# Patient Record
Sex: Male | Born: 1955 | Race: White | Hispanic: No | Marital: Single
Health system: Southern US, Community
[De-identification: ages and names within clinical notes are randomized; demographics above are authoritative.]

## PROBLEM LIST (undated history)

## (undated) DIAGNOSIS — D649 Anemia, unspecified: Secondary | ICD-10-CM

## (undated) DIAGNOSIS — J449 Chronic obstructive pulmonary disease, unspecified: Secondary | ICD-10-CM

## (undated) DIAGNOSIS — I2510 Atherosclerotic heart disease of native coronary artery without angina pectoris: Secondary | ICD-10-CM

## (undated) DIAGNOSIS — G2581 Restless legs syndrome: Secondary | ICD-10-CM

## (undated) DIAGNOSIS — Z8739 Personal history of other diseases of the musculoskeletal system and connective tissue: Secondary | ICD-10-CM

## (undated) DIAGNOSIS — Z87442 Personal history of urinary calculi: Secondary | ICD-10-CM

## (undated) DIAGNOSIS — K219 Gastro-esophageal reflux disease without esophagitis: Secondary | ICD-10-CM

## (undated) DIAGNOSIS — C679 Malignant neoplasm of bladder, unspecified: Secondary | ICD-10-CM

## (undated) DIAGNOSIS — J45909 Unspecified asthma, uncomplicated: Secondary | ICD-10-CM

## (undated) DIAGNOSIS — E119 Type 2 diabetes mellitus without complications: Secondary | ICD-10-CM

## (undated) DIAGNOSIS — N189 Chronic kidney disease, unspecified: Secondary | ICD-10-CM

## (undated) DIAGNOSIS — I429 Cardiomyopathy, unspecified: Secondary | ICD-10-CM

## (undated) DIAGNOSIS — G629 Polyneuropathy, unspecified: Secondary | ICD-10-CM

## (undated) DIAGNOSIS — I10 Essential (primary) hypertension: Secondary | ICD-10-CM

## (undated) DIAGNOSIS — F32A Depression, unspecified: Secondary | ICD-10-CM

## (undated) DIAGNOSIS — E7800 Pure hypercholesterolemia, unspecified: Secondary | ICD-10-CM

## (undated) DIAGNOSIS — E785 Hyperlipidemia, unspecified: Secondary | ICD-10-CM

## (undated) DIAGNOSIS — Z87448 Personal history of other diseases of urinary system: Secondary | ICD-10-CM

## (undated) DIAGNOSIS — N400 Enlarged prostate without lower urinary tract symptoms: Secondary | ICD-10-CM

## (undated) DIAGNOSIS — Z87440 Personal history of urinary (tract) infections: Secondary | ICD-10-CM

## (undated) DIAGNOSIS — F17200 Nicotine dependence, unspecified, uncomplicated: Secondary | ICD-10-CM

## (undated) HISTORY — PX: OTHER SURGICAL HISTORY: SHX169

## (undated) HISTORY — PX: CYSTOSCOPY W/ RETROGRADES: SHX1426

## (undated) HISTORY — PX: TRANSURETHRAL RESECTION OF BLADDER TUMOR: SHX2575

## (undated) HISTORY — PX: CYSTOSCOPY W/ URETERAL STENT PLACEMENT: SHX1429

## (undated) HISTORY — PX: CORONARY ANGIOPLASTY WITH STENT PLACEMENT: SHX49

## (undated) HISTORY — PX: CYSTOSCOPY W/ STONE MANIPULATION: SHX1427

---

## 2005-07-28 ENCOUNTER — Emergency Department: Payer: Self-pay | Admitting: Internal Medicine

## 2007-02-28 ENCOUNTER — Other Ambulatory Visit: Payer: Self-pay

## 2007-02-28 ENCOUNTER — Emergency Department: Payer: Self-pay | Admitting: Emergency Medicine

## 2007-10-27 ENCOUNTER — Emergency Department: Payer: Self-pay | Admitting: Emergency Medicine

## 2009-10-24 IMAGING — CT CT ABD-PELV W/ CM
1 of 3 series · 12 of 32 positions shown, 18 images · non-contrast
Comparison: none

REASON FOR EXAM: (1) abdominal pain, vomiting; (2) abd pain, vomiting
COMMENTS:

[Series 2: abdomen · axial · 0.82mm/px · z∈[-534,-134]mm · 12 of 60 slices shown, 18 images]
[im 5/60  soft-tissue]
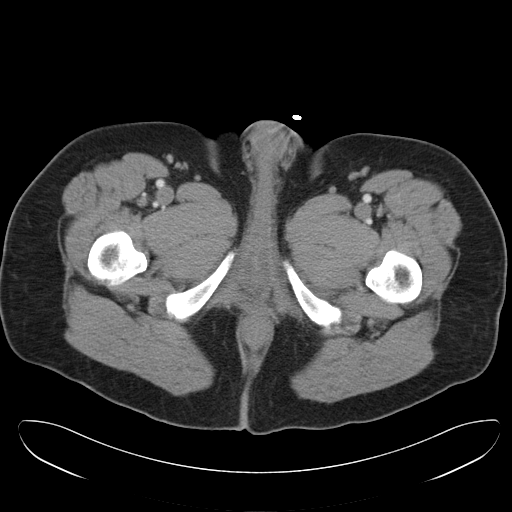
[im 5/60  bone]
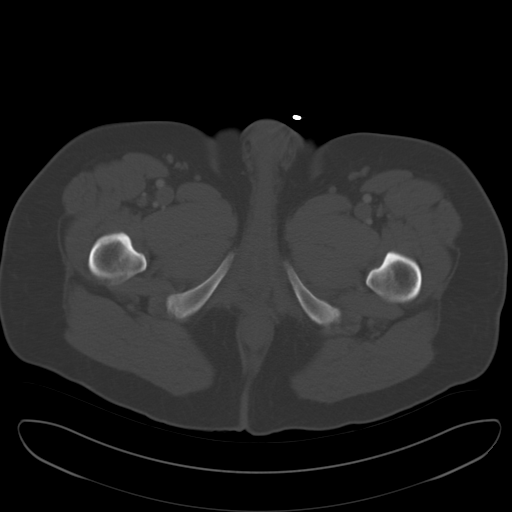
[im 10/60  soft-tissue]
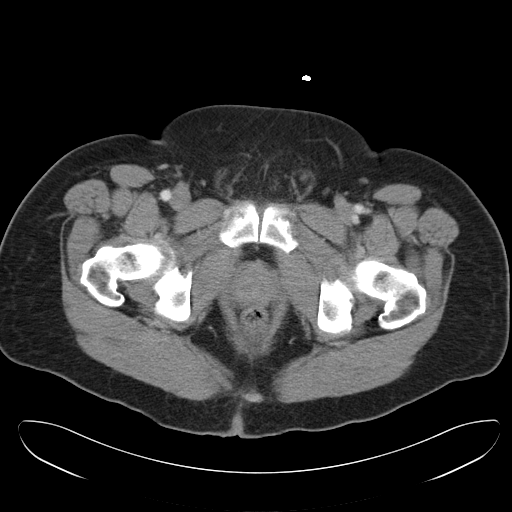
[im 14/60  soft-tissue]
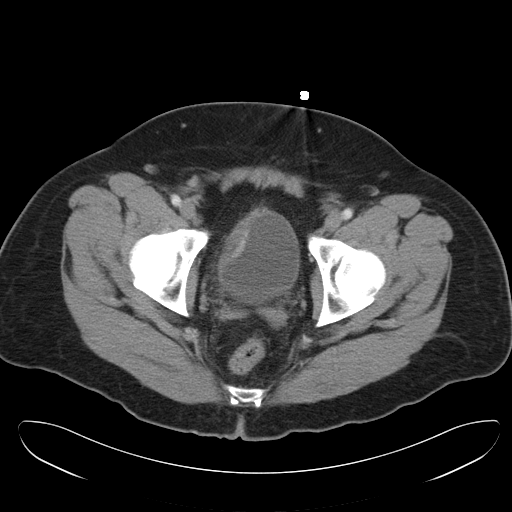
[im 19/60  soft-tissue]
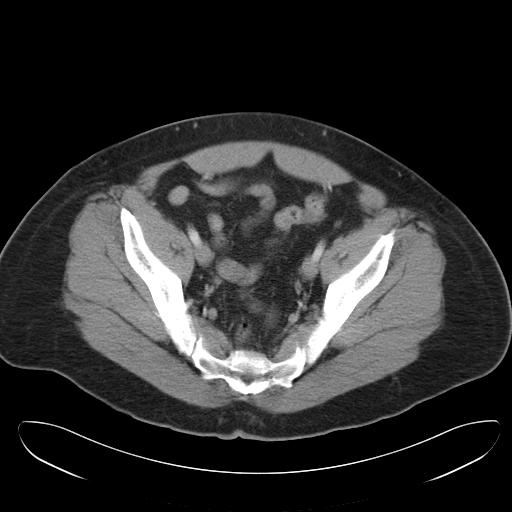
[im 23/60  soft-tissue]
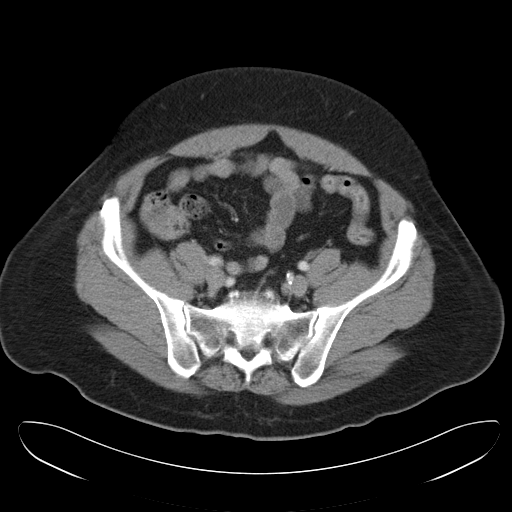
[im 28/60  soft-tissue]
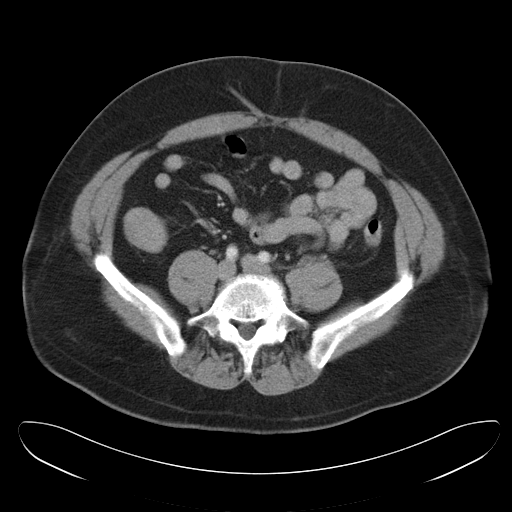
[im 32/60  soft-tissue]
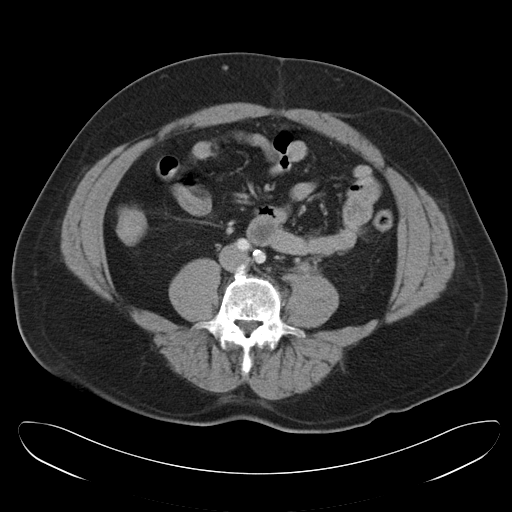
[im 37/60  soft-tissue]
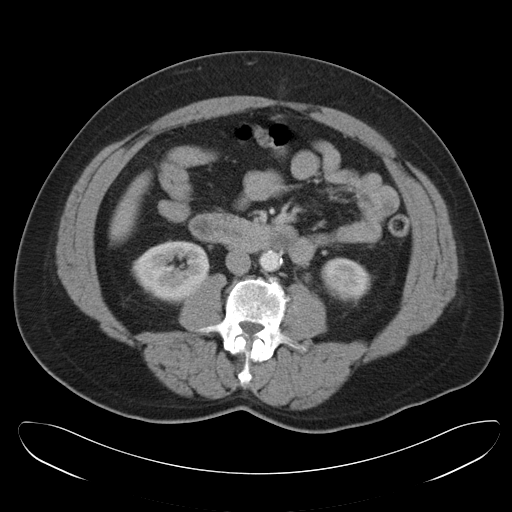
[im 41/60  soft-tissue]
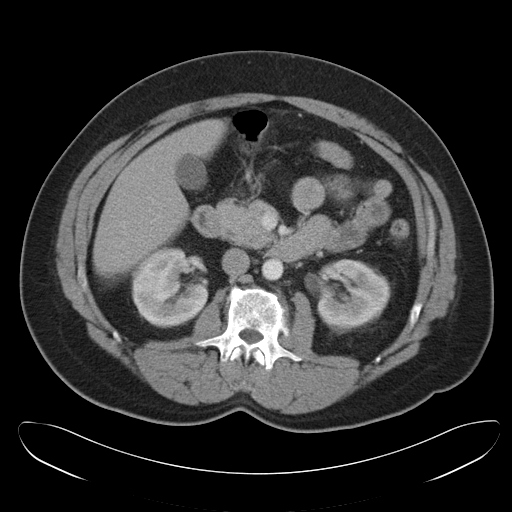
[im 41/60  lung]
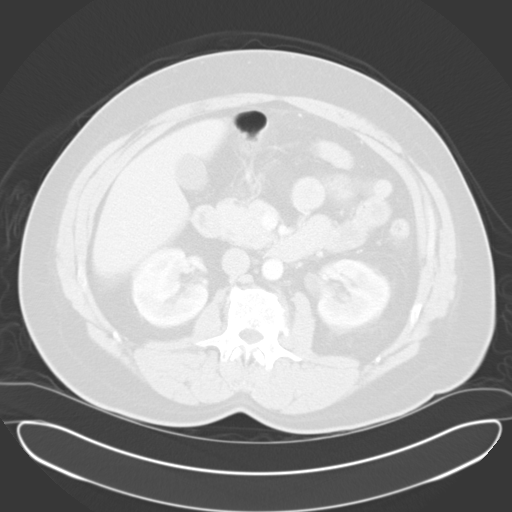
[im 41/60  bone]
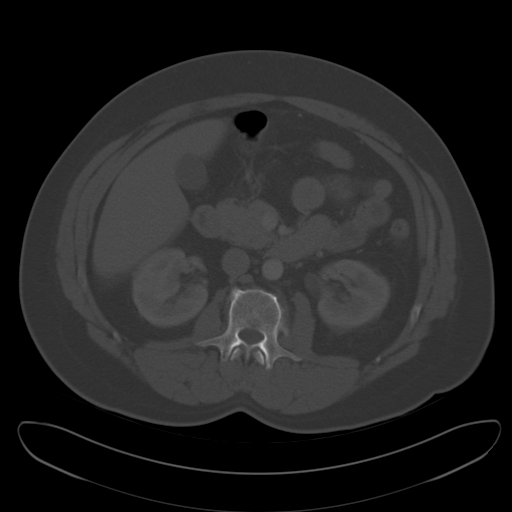
[im 46/60  soft-tissue]
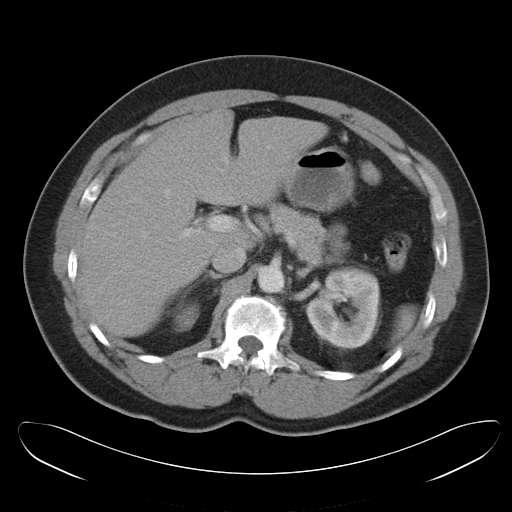
[im 46/60  lung]
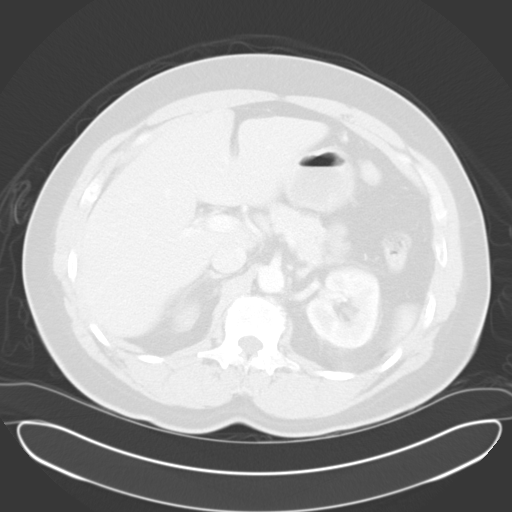
[im 50/60  soft-tissue]
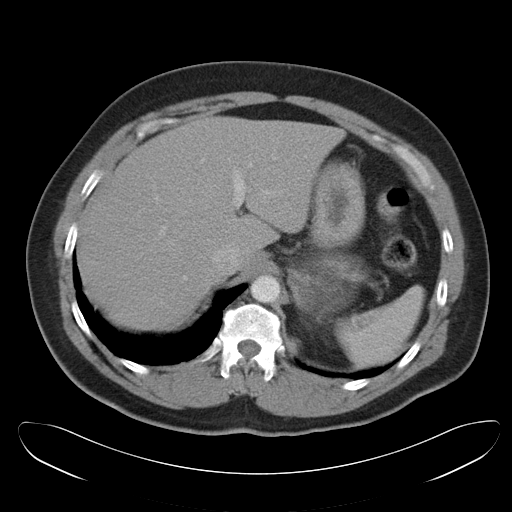
[im 50/60  lung]
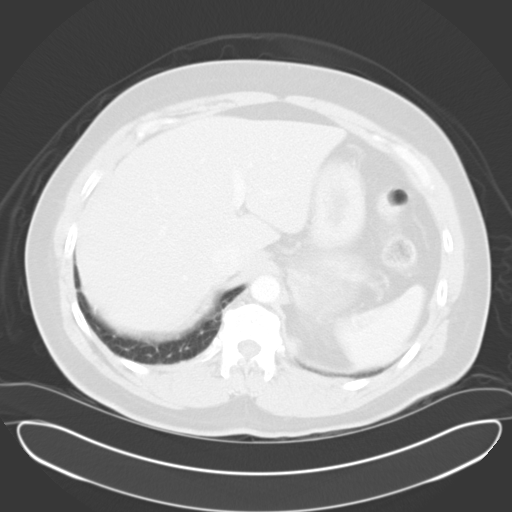
[im 55/60  soft-tissue]
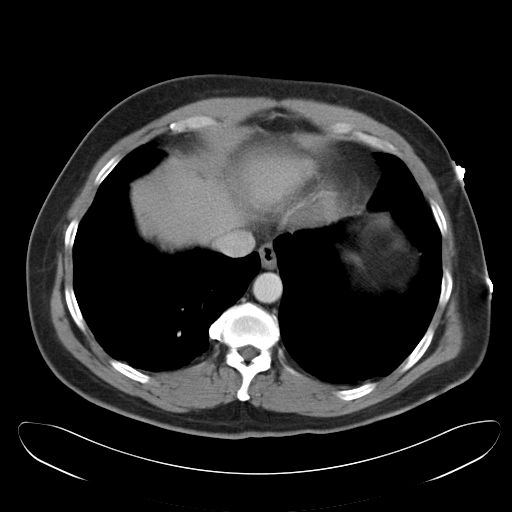
[im 55/60  lung]
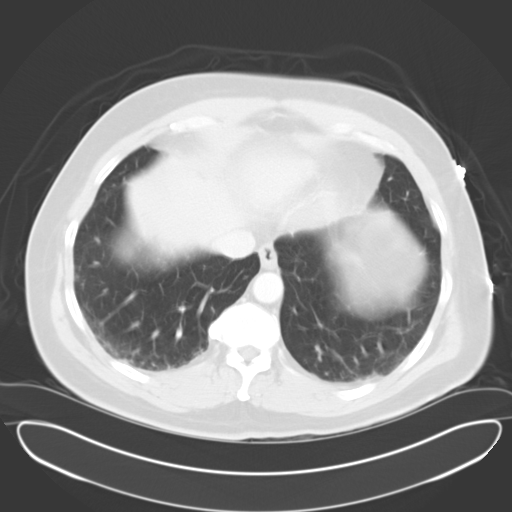

[12 of 32 positions shown; findings below may reference images not displayed]

PROCEDURE:     CT  - CT ABDOMEN / PELVIS  W  - October 27, 2007  [DATE]

RESULT:     Helical 8 mm sections were obtained from the lung bases through
the pubic symphysis status post intravenous administration of 100 ml of
Msovue-NS1.

Evaluation of the lung bases demonstrates mild hypoventilation as well as
mild interstitial and emphysematous changes.

The liver demonstrates a low attenuating 1 cm nodular area along the
periphery of the lower portion of the RIGHT lobe of the liver likely
representing a small cyst or possibly an atypical hemangioma.  No further
liver masses are identified. The spleen, adrenals, pancreas and kidneys are
unremarkable. There is no evidence of abdominal free fluid, drainable
loculated fluid collections, masses or adenopathy. There is no CT evidence
of an abdominal aortic aneurysm. Evaluation of the pelvis demonstrates no
evidence of free fluid, drainable loculated fluid collections, masses or
adenopathy. There appears to be an area of asymmetric bladder wall
thickening along the anterior lateral aspect of the urinary bladder. This
may represent the sequela of cystitis though more ominous etiologies cannot
be excluded and further evaluation with direct visualization is recommended,
if clinically warranted. There is no CT evidence of bowel obstruction,
diverticulitis, colitis, or, if clinically appropriate, secondary signs
consistent with appendicitis.
IMPRESSION: 1.Area of asymmetric bladder wall thickening as described above.
2.No further evidence of focal or acute intraabdominal or intrapelvic
pathology.

Dr. Anders of the Emergency Room was informed of these findings via a
preliminary faxed report on 10/27/07 at [DATE] a.m. Central Time.

## 2019-09-01 DIAGNOSIS — J449 Chronic obstructive pulmonary disease, unspecified: Secondary | ICD-10-CM | POA: Diagnosis not present

## 2019-09-01 DIAGNOSIS — U071 COVID-19: Secondary | ICD-10-CM | POA: Diagnosis not present

## 2019-09-01 DIAGNOSIS — J9621 Acute and chronic respiratory failure with hypoxia: Secondary | ICD-10-CM

## 2019-09-01 DIAGNOSIS — I2510 Atherosclerotic heart disease of native coronary artery without angina pectoris: Secondary | ICD-10-CM | POA: Diagnosis not present

## 2019-09-02 DIAGNOSIS — J449 Chronic obstructive pulmonary disease, unspecified: Secondary | ICD-10-CM

## 2019-09-02 DIAGNOSIS — J9621 Acute and chronic respiratory failure with hypoxia: Secondary | ICD-10-CM

## 2019-09-02 DIAGNOSIS — U071 COVID-19: Secondary | ICD-10-CM

## 2019-09-02 DIAGNOSIS — I2510 Atherosclerotic heart disease of native coronary artery without angina pectoris: Secondary | ICD-10-CM

## 2019-09-10 DIAGNOSIS — I2510 Atherosclerotic heart disease of native coronary artery without angina pectoris: Secondary | ICD-10-CM

## 2019-09-10 DIAGNOSIS — U071 COVID-19: Secondary | ICD-10-CM

## 2019-09-10 DIAGNOSIS — J449 Chronic obstructive pulmonary disease, unspecified: Secondary | ICD-10-CM | POA: Diagnosis not present

## 2019-09-10 DIAGNOSIS — J9621 Acute and chronic respiratory failure with hypoxia: Secondary | ICD-10-CM | POA: Diagnosis not present

## 2019-09-11 DIAGNOSIS — J449 Chronic obstructive pulmonary disease, unspecified: Secondary | ICD-10-CM | POA: Diagnosis not present

## 2019-09-11 DIAGNOSIS — U071 COVID-19: Secondary | ICD-10-CM | POA: Diagnosis not present

## 2019-09-11 DIAGNOSIS — J9621 Acute and chronic respiratory failure with hypoxia: Secondary | ICD-10-CM

## 2019-09-11 DIAGNOSIS — I2510 Atherosclerotic heart disease of native coronary artery without angina pectoris: Secondary | ICD-10-CM

## 2019-09-12 DIAGNOSIS — J449 Chronic obstructive pulmonary disease, unspecified: Secondary | ICD-10-CM

## 2019-09-12 DIAGNOSIS — U071 COVID-19: Secondary | ICD-10-CM

## 2019-09-12 DIAGNOSIS — I2510 Atherosclerotic heart disease of native coronary artery without angina pectoris: Secondary | ICD-10-CM

## 2019-09-12 DIAGNOSIS — J9621 Acute and chronic respiratory failure with hypoxia: Secondary | ICD-10-CM | POA: Diagnosis not present

## 2019-09-13 DIAGNOSIS — U071 COVID-19: Secondary | ICD-10-CM | POA: Diagnosis not present

## 2019-09-13 DIAGNOSIS — J449 Chronic obstructive pulmonary disease, unspecified: Secondary | ICD-10-CM

## 2019-09-13 DIAGNOSIS — J9621 Acute and chronic respiratory failure with hypoxia: Secondary | ICD-10-CM | POA: Diagnosis not present

## 2019-09-13 DIAGNOSIS — I2510 Atherosclerotic heart disease of native coronary artery without angina pectoris: Secondary | ICD-10-CM | POA: Diagnosis not present

## 2019-09-14 DIAGNOSIS — I2510 Atherosclerotic heart disease of native coronary artery without angina pectoris: Secondary | ICD-10-CM

## 2019-09-14 DIAGNOSIS — U071 COVID-19: Secondary | ICD-10-CM

## 2019-09-14 DIAGNOSIS — J9621 Acute and chronic respiratory failure with hypoxia: Secondary | ICD-10-CM | POA: Diagnosis not present

## 2019-09-14 DIAGNOSIS — J449 Chronic obstructive pulmonary disease, unspecified: Secondary | ICD-10-CM

## 2019-09-15 DIAGNOSIS — U071 COVID-19: Secondary | ICD-10-CM

## 2019-09-15 DIAGNOSIS — J449 Chronic obstructive pulmonary disease, unspecified: Secondary | ICD-10-CM

## 2019-09-15 DIAGNOSIS — J9621 Acute and chronic respiratory failure with hypoxia: Secondary | ICD-10-CM

## 2019-09-15 DIAGNOSIS — I2510 Atherosclerotic heart disease of native coronary artery without angina pectoris: Secondary | ICD-10-CM

## 2019-09-16 DIAGNOSIS — J449 Chronic obstructive pulmonary disease, unspecified: Secondary | ICD-10-CM | POA: Diagnosis not present

## 2019-09-16 DIAGNOSIS — I2510 Atherosclerotic heart disease of native coronary artery without angina pectoris: Secondary | ICD-10-CM | POA: Diagnosis not present

## 2019-09-16 DIAGNOSIS — J9621 Acute and chronic respiratory failure with hypoxia: Secondary | ICD-10-CM

## 2019-09-16 DIAGNOSIS — U071 COVID-19: Secondary | ICD-10-CM

## 2019-09-24 DIAGNOSIS — J449 Chronic obstructive pulmonary disease, unspecified: Secondary | ICD-10-CM

## 2019-09-24 DIAGNOSIS — I2510 Atherosclerotic heart disease of native coronary artery without angina pectoris: Secondary | ICD-10-CM | POA: Diagnosis not present

## 2019-09-24 DIAGNOSIS — J9621 Acute and chronic respiratory failure with hypoxia: Secondary | ICD-10-CM

## 2019-09-24 DIAGNOSIS — U071 COVID-19: Secondary | ICD-10-CM

## 2019-09-25 DIAGNOSIS — J9621 Acute and chronic respiratory failure with hypoxia: Secondary | ICD-10-CM

## 2019-09-25 DIAGNOSIS — I2510 Atherosclerotic heart disease of native coronary artery without angina pectoris: Secondary | ICD-10-CM

## 2019-09-25 DIAGNOSIS — J449 Chronic obstructive pulmonary disease, unspecified: Secondary | ICD-10-CM

## 2019-09-25 DIAGNOSIS — U071 COVID-19: Secondary | ICD-10-CM

## 2019-09-26 DIAGNOSIS — J9621 Acute and chronic respiratory failure with hypoxia: Secondary | ICD-10-CM

## 2019-09-26 DIAGNOSIS — U071 COVID-19: Secondary | ICD-10-CM | POA: Diagnosis not present

## 2019-09-26 DIAGNOSIS — J449 Chronic obstructive pulmonary disease, unspecified: Secondary | ICD-10-CM | POA: Diagnosis not present

## 2019-09-26 DIAGNOSIS — I2510 Atherosclerotic heart disease of native coronary artery without angina pectoris: Secondary | ICD-10-CM | POA: Diagnosis not present

## 2019-09-27 DIAGNOSIS — I2510 Atherosclerotic heart disease of native coronary artery without angina pectoris: Secondary | ICD-10-CM

## 2019-09-27 DIAGNOSIS — J449 Chronic obstructive pulmonary disease, unspecified: Secondary | ICD-10-CM

## 2019-09-27 DIAGNOSIS — U071 COVID-19: Secondary | ICD-10-CM | POA: Diagnosis not present

## 2019-09-27 DIAGNOSIS — J9621 Acute and chronic respiratory failure with hypoxia: Secondary | ICD-10-CM | POA: Diagnosis not present

## 2019-09-28 DIAGNOSIS — I2510 Atherosclerotic heart disease of native coronary artery without angina pectoris: Secondary | ICD-10-CM

## 2019-09-28 DIAGNOSIS — U071 COVID-19: Secondary | ICD-10-CM

## 2019-09-28 DIAGNOSIS — J449 Chronic obstructive pulmonary disease, unspecified: Secondary | ICD-10-CM | POA: Diagnosis not present

## 2019-09-28 DIAGNOSIS — J9621 Acute and chronic respiratory failure with hypoxia: Secondary | ICD-10-CM

## 2019-09-29 DIAGNOSIS — J9621 Acute and chronic respiratory failure with hypoxia: Secondary | ICD-10-CM

## 2019-09-29 DIAGNOSIS — I2510 Atherosclerotic heart disease of native coronary artery without angina pectoris: Secondary | ICD-10-CM

## 2019-09-29 DIAGNOSIS — J449 Chronic obstructive pulmonary disease, unspecified: Secondary | ICD-10-CM | POA: Diagnosis not present

## 2019-09-29 DIAGNOSIS — U071 COVID-19: Secondary | ICD-10-CM | POA: Diagnosis not present

## 2019-09-30 DIAGNOSIS — U071 COVID-19: Secondary | ICD-10-CM

## 2019-09-30 DIAGNOSIS — I2510 Atherosclerotic heart disease of native coronary artery without angina pectoris: Secondary | ICD-10-CM | POA: Diagnosis not present

## 2019-09-30 DIAGNOSIS — J9621 Acute and chronic respiratory failure with hypoxia: Secondary | ICD-10-CM

## 2019-09-30 DIAGNOSIS — J449 Chronic obstructive pulmonary disease, unspecified: Secondary | ICD-10-CM

## 2019-10-08 DIAGNOSIS — J449 Chronic obstructive pulmonary disease, unspecified: Secondary | ICD-10-CM | POA: Diagnosis not present

## 2019-10-08 DIAGNOSIS — U071 COVID-19: Secondary | ICD-10-CM | POA: Diagnosis not present

## 2019-10-08 DIAGNOSIS — I2510 Atherosclerotic heart disease of native coronary artery without angina pectoris: Secondary | ICD-10-CM | POA: Diagnosis not present

## 2019-10-08 DIAGNOSIS — J9621 Acute and chronic respiratory failure with hypoxia: Secondary | ICD-10-CM

## 2019-10-09 DIAGNOSIS — J9621 Acute and chronic respiratory failure with hypoxia: Secondary | ICD-10-CM

## 2019-10-09 DIAGNOSIS — J449 Chronic obstructive pulmonary disease, unspecified: Secondary | ICD-10-CM | POA: Diagnosis not present

## 2019-10-09 DIAGNOSIS — I2510 Atherosclerotic heart disease of native coronary artery without angina pectoris: Secondary | ICD-10-CM | POA: Diagnosis not present

## 2019-10-09 DIAGNOSIS — U071 COVID-19: Secondary | ICD-10-CM

## 2019-10-10 DIAGNOSIS — J9621 Acute and chronic respiratory failure with hypoxia: Secondary | ICD-10-CM | POA: Diagnosis not present

## 2019-10-10 DIAGNOSIS — I2510 Atherosclerotic heart disease of native coronary artery without angina pectoris: Secondary | ICD-10-CM

## 2019-10-10 DIAGNOSIS — U071 COVID-19: Secondary | ICD-10-CM | POA: Diagnosis not present

## 2019-10-10 DIAGNOSIS — J449 Chronic obstructive pulmonary disease, unspecified: Secondary | ICD-10-CM | POA: Diagnosis not present

## 2019-10-11 DIAGNOSIS — I2510 Atherosclerotic heart disease of native coronary artery without angina pectoris: Secondary | ICD-10-CM | POA: Diagnosis not present

## 2019-10-11 DIAGNOSIS — J449 Chronic obstructive pulmonary disease, unspecified: Secondary | ICD-10-CM

## 2019-10-11 DIAGNOSIS — J9621 Acute and chronic respiratory failure with hypoxia: Secondary | ICD-10-CM

## 2019-10-11 DIAGNOSIS — U071 COVID-19: Secondary | ICD-10-CM | POA: Diagnosis not present

## 2019-10-12 DIAGNOSIS — U071 COVID-19: Secondary | ICD-10-CM

## 2019-10-12 DIAGNOSIS — J9621 Acute and chronic respiratory failure with hypoxia: Secondary | ICD-10-CM

## 2019-10-12 DIAGNOSIS — J449 Chronic obstructive pulmonary disease, unspecified: Secondary | ICD-10-CM

## 2019-10-12 DIAGNOSIS — I2510 Atherosclerotic heart disease of native coronary artery without angina pectoris: Secondary | ICD-10-CM

## 2019-10-13 DIAGNOSIS — I2510 Atherosclerotic heart disease of native coronary artery without angina pectoris: Secondary | ICD-10-CM | POA: Diagnosis not present

## 2019-10-13 DIAGNOSIS — J449 Chronic obstructive pulmonary disease, unspecified: Secondary | ICD-10-CM

## 2019-10-13 DIAGNOSIS — J9621 Acute and chronic respiratory failure with hypoxia: Secondary | ICD-10-CM

## 2019-10-13 DIAGNOSIS — U071 COVID-19: Secondary | ICD-10-CM

## 2019-10-14 DIAGNOSIS — J9621 Acute and chronic respiratory failure with hypoxia: Secondary | ICD-10-CM

## 2019-10-14 DIAGNOSIS — U071 COVID-19: Secondary | ICD-10-CM

## 2019-10-14 DIAGNOSIS — J449 Chronic obstructive pulmonary disease, unspecified: Secondary | ICD-10-CM

## 2019-10-14 DIAGNOSIS — I2510 Atherosclerotic heart disease of native coronary artery without angina pectoris: Secondary | ICD-10-CM

## 2019-10-19 DIAGNOSIS — U071 COVID-19: Secondary | ICD-10-CM

## 2019-10-19 DIAGNOSIS — I2510 Atherosclerotic heart disease of native coronary artery without angina pectoris: Secondary | ICD-10-CM | POA: Diagnosis not present

## 2019-10-19 DIAGNOSIS — J449 Chronic obstructive pulmonary disease, unspecified: Secondary | ICD-10-CM

## 2019-10-19 DIAGNOSIS — J9621 Acute and chronic respiratory failure with hypoxia: Secondary | ICD-10-CM

## 2019-10-20 DIAGNOSIS — U071 COVID-19: Secondary | ICD-10-CM

## 2019-10-20 DIAGNOSIS — J449 Chronic obstructive pulmonary disease, unspecified: Secondary | ICD-10-CM

## 2019-10-20 DIAGNOSIS — I2510 Atherosclerotic heart disease of native coronary artery without angina pectoris: Secondary | ICD-10-CM

## 2019-10-20 DIAGNOSIS — J9621 Acute and chronic respiratory failure with hypoxia: Secondary | ICD-10-CM | POA: Diagnosis not present

## 2019-10-21 DIAGNOSIS — I2510 Atherosclerotic heart disease of native coronary artery without angina pectoris: Secondary | ICD-10-CM

## 2019-10-21 DIAGNOSIS — J9621 Acute and chronic respiratory failure with hypoxia: Secondary | ICD-10-CM | POA: Diagnosis not present

## 2019-10-21 DIAGNOSIS — J449 Chronic obstructive pulmonary disease, unspecified: Secondary | ICD-10-CM | POA: Diagnosis not present

## 2019-10-21 DIAGNOSIS — U071 COVID-19: Secondary | ICD-10-CM | POA: Diagnosis not present

## 2019-10-22 DIAGNOSIS — J9621 Acute and chronic respiratory failure with hypoxia: Secondary | ICD-10-CM

## 2019-10-22 DIAGNOSIS — J449 Chronic obstructive pulmonary disease, unspecified: Secondary | ICD-10-CM

## 2019-10-22 DIAGNOSIS — U071 COVID-19: Secondary | ICD-10-CM | POA: Diagnosis not present

## 2019-10-22 DIAGNOSIS — I2510 Atherosclerotic heart disease of native coronary artery without angina pectoris: Secondary | ICD-10-CM | POA: Diagnosis not present

## 2019-10-23 DIAGNOSIS — U071 COVID-19: Secondary | ICD-10-CM | POA: Diagnosis not present

## 2019-10-23 DIAGNOSIS — J9621 Acute and chronic respiratory failure with hypoxia: Secondary | ICD-10-CM

## 2019-10-23 DIAGNOSIS — J449 Chronic obstructive pulmonary disease, unspecified: Secondary | ICD-10-CM | POA: Diagnosis not present

## 2019-10-23 DIAGNOSIS — I2510 Atherosclerotic heart disease of native coronary artery without angina pectoris: Secondary | ICD-10-CM

## 2019-10-24 DIAGNOSIS — U071 COVID-19: Secondary | ICD-10-CM | POA: Diagnosis not present

## 2019-10-24 DIAGNOSIS — I2510 Atherosclerotic heart disease of native coronary artery without angina pectoris: Secondary | ICD-10-CM

## 2019-10-24 DIAGNOSIS — J9621 Acute and chronic respiratory failure with hypoxia: Secondary | ICD-10-CM

## 2019-10-24 DIAGNOSIS — J449 Chronic obstructive pulmonary disease, unspecified: Secondary | ICD-10-CM

## 2019-10-25 DIAGNOSIS — U071 COVID-19: Secondary | ICD-10-CM | POA: Diagnosis not present

## 2019-10-25 DIAGNOSIS — J449 Chronic obstructive pulmonary disease, unspecified: Secondary | ICD-10-CM

## 2019-10-25 DIAGNOSIS — I2510 Atherosclerotic heart disease of native coronary artery without angina pectoris: Secondary | ICD-10-CM | POA: Diagnosis not present

## 2019-10-25 DIAGNOSIS — J9621 Acute and chronic respiratory failure with hypoxia: Secondary | ICD-10-CM

## 2019-11-05 DIAGNOSIS — U071 COVID-19: Secondary | ICD-10-CM

## 2019-11-05 DIAGNOSIS — J449 Chronic obstructive pulmonary disease, unspecified: Secondary | ICD-10-CM

## 2019-11-05 DIAGNOSIS — I2510 Atherosclerotic heart disease of native coronary artery without angina pectoris: Secondary | ICD-10-CM

## 2019-11-05 DIAGNOSIS — J9621 Acute and chronic respiratory failure with hypoxia: Secondary | ICD-10-CM

## 2019-11-06 DIAGNOSIS — U071 COVID-19: Secondary | ICD-10-CM

## 2019-11-06 DIAGNOSIS — J9621 Acute and chronic respiratory failure with hypoxia: Secondary | ICD-10-CM

## 2019-11-06 DIAGNOSIS — J449 Chronic obstructive pulmonary disease, unspecified: Secondary | ICD-10-CM

## 2019-11-06 DIAGNOSIS — I2510 Atherosclerotic heart disease of native coronary artery without angina pectoris: Secondary | ICD-10-CM | POA: Diagnosis not present

## 2019-11-07 DIAGNOSIS — I2510 Atherosclerotic heart disease of native coronary artery without angina pectoris: Secondary | ICD-10-CM

## 2019-11-07 DIAGNOSIS — J449 Chronic obstructive pulmonary disease, unspecified: Secondary | ICD-10-CM | POA: Diagnosis not present

## 2019-11-07 DIAGNOSIS — J9621 Acute and chronic respiratory failure with hypoxia: Secondary | ICD-10-CM

## 2019-11-07 DIAGNOSIS — U071 COVID-19: Secondary | ICD-10-CM

## 2019-11-08 DIAGNOSIS — J9621 Acute and chronic respiratory failure with hypoxia: Secondary | ICD-10-CM

## 2019-11-08 DIAGNOSIS — J449 Chronic obstructive pulmonary disease, unspecified: Secondary | ICD-10-CM

## 2019-11-08 DIAGNOSIS — I2510 Atherosclerotic heart disease of native coronary artery without angina pectoris: Secondary | ICD-10-CM | POA: Diagnosis not present

## 2019-11-08 DIAGNOSIS — U071 COVID-19: Secondary | ICD-10-CM

## 2019-11-09 DIAGNOSIS — J9621 Acute and chronic respiratory failure with hypoxia: Secondary | ICD-10-CM | POA: Diagnosis not present

## 2019-11-09 DIAGNOSIS — I2510 Atherosclerotic heart disease of native coronary artery without angina pectoris: Secondary | ICD-10-CM

## 2019-11-09 DIAGNOSIS — U071 COVID-19: Secondary | ICD-10-CM | POA: Diagnosis not present

## 2019-11-09 DIAGNOSIS — J449 Chronic obstructive pulmonary disease, unspecified: Secondary | ICD-10-CM | POA: Diagnosis not present

## 2019-11-10 DIAGNOSIS — U071 COVID-19: Secondary | ICD-10-CM

## 2019-11-10 DIAGNOSIS — J449 Chronic obstructive pulmonary disease, unspecified: Secondary | ICD-10-CM

## 2019-11-10 DIAGNOSIS — J9621 Acute and chronic respiratory failure with hypoxia: Secondary | ICD-10-CM

## 2019-11-10 DIAGNOSIS — I2510 Atherosclerotic heart disease of native coronary artery without angina pectoris: Secondary | ICD-10-CM

## 2019-11-11 DIAGNOSIS — I2510 Atherosclerotic heart disease of native coronary artery without angina pectoris: Secondary | ICD-10-CM

## 2019-11-11 DIAGNOSIS — J9621 Acute and chronic respiratory failure with hypoxia: Secondary | ICD-10-CM | POA: Diagnosis not present

## 2019-11-11 DIAGNOSIS — J449 Chronic obstructive pulmonary disease, unspecified: Secondary | ICD-10-CM | POA: Diagnosis not present

## 2019-11-11 DIAGNOSIS — U071 COVID-19: Secondary | ICD-10-CM | POA: Diagnosis not present

## 2019-11-19 DIAGNOSIS — U071 COVID-19: Secondary | ICD-10-CM

## 2019-11-19 DIAGNOSIS — J449 Chronic obstructive pulmonary disease, unspecified: Secondary | ICD-10-CM | POA: Diagnosis not present

## 2019-11-19 DIAGNOSIS — J9621 Acute and chronic respiratory failure with hypoxia: Secondary | ICD-10-CM | POA: Diagnosis not present

## 2019-11-19 DIAGNOSIS — I2510 Atherosclerotic heart disease of native coronary artery without angina pectoris: Secondary | ICD-10-CM

## 2019-11-20 DIAGNOSIS — I2510 Atherosclerotic heart disease of native coronary artery without angina pectoris: Secondary | ICD-10-CM

## 2019-11-20 DIAGNOSIS — J9621 Acute and chronic respiratory failure with hypoxia: Secondary | ICD-10-CM

## 2019-11-20 DIAGNOSIS — U071 COVID-19: Secondary | ICD-10-CM | POA: Diagnosis not present

## 2019-11-20 DIAGNOSIS — J449 Chronic obstructive pulmonary disease, unspecified: Secondary | ICD-10-CM | POA: Diagnosis not present

## 2019-11-21 DIAGNOSIS — J449 Chronic obstructive pulmonary disease, unspecified: Secondary | ICD-10-CM | POA: Diagnosis not present

## 2019-11-21 DIAGNOSIS — J9621 Acute and chronic respiratory failure with hypoxia: Secondary | ICD-10-CM | POA: Diagnosis not present

## 2019-11-21 DIAGNOSIS — U071 COVID-19: Secondary | ICD-10-CM

## 2019-11-21 DIAGNOSIS — I2510 Atherosclerotic heart disease of native coronary artery without angina pectoris: Secondary | ICD-10-CM | POA: Diagnosis not present

## 2019-11-22 DIAGNOSIS — I2510 Atherosclerotic heart disease of native coronary artery without angina pectoris: Secondary | ICD-10-CM | POA: Diagnosis not present

## 2019-11-22 DIAGNOSIS — U071 COVID-19: Secondary | ICD-10-CM

## 2019-11-22 DIAGNOSIS — J449 Chronic obstructive pulmonary disease, unspecified: Secondary | ICD-10-CM

## 2019-11-22 DIAGNOSIS — J9621 Acute and chronic respiratory failure with hypoxia: Secondary | ICD-10-CM | POA: Diagnosis not present

## 2019-11-23 DIAGNOSIS — I2510 Atherosclerotic heart disease of native coronary artery without angina pectoris: Secondary | ICD-10-CM

## 2019-11-23 DIAGNOSIS — J449 Chronic obstructive pulmonary disease, unspecified: Secondary | ICD-10-CM

## 2019-11-23 DIAGNOSIS — J9621 Acute and chronic respiratory failure with hypoxia: Secondary | ICD-10-CM | POA: Diagnosis not present

## 2019-11-23 DIAGNOSIS — U071 COVID-19: Secondary | ICD-10-CM

## 2019-11-24 DIAGNOSIS — U071 COVID-19: Secondary | ICD-10-CM

## 2019-11-24 DIAGNOSIS — J449 Chronic obstructive pulmonary disease, unspecified: Secondary | ICD-10-CM | POA: Diagnosis not present

## 2019-11-24 DIAGNOSIS — I2510 Atherosclerotic heart disease of native coronary artery without angina pectoris: Secondary | ICD-10-CM

## 2019-11-24 DIAGNOSIS — J9621 Acute and chronic respiratory failure with hypoxia: Secondary | ICD-10-CM | POA: Diagnosis not present

## 2019-11-25 DIAGNOSIS — J449 Chronic obstructive pulmonary disease, unspecified: Secondary | ICD-10-CM | POA: Diagnosis not present

## 2019-11-25 DIAGNOSIS — U071 COVID-19: Secondary | ICD-10-CM | POA: Diagnosis not present

## 2019-11-25 DIAGNOSIS — I2510 Atherosclerotic heart disease of native coronary artery without angina pectoris: Secondary | ICD-10-CM

## 2019-11-25 DIAGNOSIS — J9621 Acute and chronic respiratory failure with hypoxia: Secondary | ICD-10-CM | POA: Diagnosis not present

## 2019-12-03 DIAGNOSIS — U071 COVID-19: Secondary | ICD-10-CM | POA: Diagnosis not present

## 2019-12-03 DIAGNOSIS — J449 Chronic obstructive pulmonary disease, unspecified: Secondary | ICD-10-CM | POA: Diagnosis not present

## 2019-12-03 DIAGNOSIS — J9621 Acute and chronic respiratory failure with hypoxia: Secondary | ICD-10-CM | POA: Diagnosis not present

## 2019-12-03 DIAGNOSIS — I2510 Atherosclerotic heart disease of native coronary artery without angina pectoris: Secondary | ICD-10-CM

## 2019-12-04 DIAGNOSIS — J449 Chronic obstructive pulmonary disease, unspecified: Secondary | ICD-10-CM

## 2019-12-04 DIAGNOSIS — I2510 Atherosclerotic heart disease of native coronary artery without angina pectoris: Secondary | ICD-10-CM | POA: Diagnosis not present

## 2019-12-04 DIAGNOSIS — J9621 Acute and chronic respiratory failure with hypoxia: Secondary | ICD-10-CM

## 2019-12-04 DIAGNOSIS — U071 COVID-19: Secondary | ICD-10-CM | POA: Diagnosis not present

## 2019-12-05 DIAGNOSIS — J449 Chronic obstructive pulmonary disease, unspecified: Secondary | ICD-10-CM | POA: Diagnosis not present

## 2019-12-05 DIAGNOSIS — I2510 Atherosclerotic heart disease of native coronary artery without angina pectoris: Secondary | ICD-10-CM | POA: Diagnosis not present

## 2019-12-05 DIAGNOSIS — J9621 Acute and chronic respiratory failure with hypoxia: Secondary | ICD-10-CM | POA: Diagnosis not present

## 2019-12-05 DIAGNOSIS — U071 COVID-19: Secondary | ICD-10-CM | POA: Diagnosis not present

## 2019-12-06 DIAGNOSIS — J449 Chronic obstructive pulmonary disease, unspecified: Secondary | ICD-10-CM | POA: Diagnosis not present

## 2019-12-06 DIAGNOSIS — U071 COVID-19: Secondary | ICD-10-CM | POA: Diagnosis not present

## 2019-12-06 DIAGNOSIS — I2510 Atherosclerotic heart disease of native coronary artery without angina pectoris: Secondary | ICD-10-CM | POA: Diagnosis not present

## 2019-12-06 DIAGNOSIS — J9621 Acute and chronic respiratory failure with hypoxia: Secondary | ICD-10-CM | POA: Diagnosis not present

## 2019-12-14 DIAGNOSIS — J9621 Acute and chronic respiratory failure with hypoxia: Secondary | ICD-10-CM | POA: Diagnosis not present

## 2019-12-14 DIAGNOSIS — U071 COVID-19: Secondary | ICD-10-CM | POA: Diagnosis not present

## 2019-12-14 DIAGNOSIS — I2510 Atherosclerotic heart disease of native coronary artery without angina pectoris: Secondary | ICD-10-CM | POA: Diagnosis not present

## 2019-12-14 DIAGNOSIS — J449 Chronic obstructive pulmonary disease, unspecified: Secondary | ICD-10-CM

## 2019-12-15 DIAGNOSIS — I2510 Atherosclerotic heart disease of native coronary artery without angina pectoris: Secondary | ICD-10-CM

## 2019-12-15 DIAGNOSIS — J9621 Acute and chronic respiratory failure with hypoxia: Secondary | ICD-10-CM

## 2019-12-15 DIAGNOSIS — J449 Chronic obstructive pulmonary disease, unspecified: Secondary | ICD-10-CM | POA: Diagnosis not present

## 2019-12-15 DIAGNOSIS — U071 COVID-19: Secondary | ICD-10-CM | POA: Diagnosis not present

## 2019-12-16 DIAGNOSIS — J9621 Acute and chronic respiratory failure with hypoxia: Secondary | ICD-10-CM | POA: Diagnosis not present

## 2019-12-16 DIAGNOSIS — I2510 Atherosclerotic heart disease of native coronary artery without angina pectoris: Secondary | ICD-10-CM

## 2019-12-16 DIAGNOSIS — J449 Chronic obstructive pulmonary disease, unspecified: Secondary | ICD-10-CM | POA: Diagnosis not present

## 2019-12-16 DIAGNOSIS — U071 COVID-19: Secondary | ICD-10-CM

## 2019-12-17 DIAGNOSIS — J449 Chronic obstructive pulmonary disease, unspecified: Secondary | ICD-10-CM | POA: Diagnosis not present

## 2019-12-17 DIAGNOSIS — J9621 Acute and chronic respiratory failure with hypoxia: Secondary | ICD-10-CM

## 2019-12-17 DIAGNOSIS — I2510 Atherosclerotic heart disease of native coronary artery without angina pectoris: Secondary | ICD-10-CM

## 2019-12-17 DIAGNOSIS — U071 COVID-19: Secondary | ICD-10-CM | POA: Diagnosis not present

## 2019-12-18 DIAGNOSIS — U071 COVID-19: Secondary | ICD-10-CM

## 2019-12-18 DIAGNOSIS — I2510 Atherosclerotic heart disease of native coronary artery without angina pectoris: Secondary | ICD-10-CM | POA: Diagnosis not present

## 2019-12-18 DIAGNOSIS — J9621 Acute and chronic respiratory failure with hypoxia: Secondary | ICD-10-CM

## 2019-12-18 DIAGNOSIS — J449 Chronic obstructive pulmonary disease, unspecified: Secondary | ICD-10-CM | POA: Diagnosis not present

## 2019-12-19 DIAGNOSIS — I2510 Atherosclerotic heart disease of native coronary artery without angina pectoris: Secondary | ICD-10-CM | POA: Diagnosis not present

## 2019-12-19 DIAGNOSIS — J449 Chronic obstructive pulmonary disease, unspecified: Secondary | ICD-10-CM | POA: Diagnosis not present

## 2019-12-19 DIAGNOSIS — J9621 Acute and chronic respiratory failure with hypoxia: Secondary | ICD-10-CM

## 2019-12-19 DIAGNOSIS — U071 COVID-19: Secondary | ICD-10-CM

## 2019-12-20 DIAGNOSIS — U071 COVID-19: Secondary | ICD-10-CM

## 2019-12-20 DIAGNOSIS — I2510 Atherosclerotic heart disease of native coronary artery without angina pectoris: Secondary | ICD-10-CM | POA: Diagnosis not present

## 2019-12-20 DIAGNOSIS — J449 Chronic obstructive pulmonary disease, unspecified: Secondary | ICD-10-CM | POA: Diagnosis not present

## 2019-12-20 DIAGNOSIS — J9621 Acute and chronic respiratory failure with hypoxia: Secondary | ICD-10-CM | POA: Diagnosis not present

## 2019-12-21 DIAGNOSIS — I2510 Atherosclerotic heart disease of native coronary artery without angina pectoris: Secondary | ICD-10-CM

## 2019-12-21 DIAGNOSIS — J449 Chronic obstructive pulmonary disease, unspecified: Secondary | ICD-10-CM | POA: Diagnosis not present

## 2019-12-21 DIAGNOSIS — J9621 Acute and chronic respiratory failure with hypoxia: Secondary | ICD-10-CM

## 2019-12-21 DIAGNOSIS — U071 COVID-19: Secondary | ICD-10-CM

## 2019-12-22 DIAGNOSIS — J449 Chronic obstructive pulmonary disease, unspecified: Secondary | ICD-10-CM | POA: Diagnosis not present

## 2019-12-22 DIAGNOSIS — U071 COVID-19: Secondary | ICD-10-CM | POA: Diagnosis not present

## 2019-12-22 DIAGNOSIS — J9621 Acute and chronic respiratory failure with hypoxia: Secondary | ICD-10-CM

## 2019-12-22 DIAGNOSIS — I2510 Atherosclerotic heart disease of native coronary artery without angina pectoris: Secondary | ICD-10-CM

## 2019-12-23 DIAGNOSIS — I2510 Atherosclerotic heart disease of native coronary artery without angina pectoris: Secondary | ICD-10-CM

## 2019-12-23 DIAGNOSIS — J9621 Acute and chronic respiratory failure with hypoxia: Secondary | ICD-10-CM | POA: Diagnosis not present

## 2019-12-23 DIAGNOSIS — J449 Chronic obstructive pulmonary disease, unspecified: Secondary | ICD-10-CM

## 2019-12-23 DIAGNOSIS — U071 COVID-19: Secondary | ICD-10-CM | POA: Diagnosis not present

## 2019-12-31 DIAGNOSIS — J9621 Acute and chronic respiratory failure with hypoxia: Secondary | ICD-10-CM

## 2019-12-31 DIAGNOSIS — J449 Chronic obstructive pulmonary disease, unspecified: Secondary | ICD-10-CM | POA: Diagnosis not present

## 2019-12-31 DIAGNOSIS — I2510 Atherosclerotic heart disease of native coronary artery without angina pectoris: Secondary | ICD-10-CM | POA: Diagnosis not present

## 2019-12-31 DIAGNOSIS — U071 COVID-19: Secondary | ICD-10-CM

## 2020-01-01 DIAGNOSIS — J9621 Acute and chronic respiratory failure with hypoxia: Secondary | ICD-10-CM | POA: Diagnosis not present

## 2020-01-01 DIAGNOSIS — I2510 Atherosclerotic heart disease of native coronary artery without angina pectoris: Secondary | ICD-10-CM | POA: Diagnosis not present

## 2020-01-01 DIAGNOSIS — U071 COVID-19: Secondary | ICD-10-CM

## 2020-01-01 DIAGNOSIS — J449 Chronic obstructive pulmonary disease, unspecified: Secondary | ICD-10-CM | POA: Diagnosis not present

## 2020-01-02 DIAGNOSIS — J9621 Acute and chronic respiratory failure with hypoxia: Secondary | ICD-10-CM

## 2020-01-02 DIAGNOSIS — U071 COVID-19: Secondary | ICD-10-CM

## 2020-01-02 DIAGNOSIS — I2510 Atherosclerotic heart disease of native coronary artery without angina pectoris: Secondary | ICD-10-CM | POA: Diagnosis not present

## 2020-01-02 DIAGNOSIS — J449 Chronic obstructive pulmonary disease, unspecified: Secondary | ICD-10-CM

## 2020-01-03 DIAGNOSIS — U071 COVID-19: Secondary | ICD-10-CM | POA: Diagnosis not present

## 2020-01-03 DIAGNOSIS — I2510 Atherosclerotic heart disease of native coronary artery without angina pectoris: Secondary | ICD-10-CM | POA: Diagnosis not present

## 2020-01-03 DIAGNOSIS — J449 Chronic obstructive pulmonary disease, unspecified: Secondary | ICD-10-CM | POA: Diagnosis not present

## 2020-01-03 DIAGNOSIS — J9621 Acute and chronic respiratory failure with hypoxia: Secondary | ICD-10-CM

## 2020-01-04 DIAGNOSIS — J9621 Acute and chronic respiratory failure with hypoxia: Secondary | ICD-10-CM | POA: Diagnosis not present

## 2020-01-04 DIAGNOSIS — I2510 Atherosclerotic heart disease of native coronary artery without angina pectoris: Secondary | ICD-10-CM | POA: Diagnosis not present

## 2020-01-04 DIAGNOSIS — J449 Chronic obstructive pulmonary disease, unspecified: Secondary | ICD-10-CM

## 2020-01-04 DIAGNOSIS — U071 COVID-19: Secondary | ICD-10-CM | POA: Diagnosis not present

## 2020-01-05 DIAGNOSIS — U071 COVID-19: Secondary | ICD-10-CM | POA: Diagnosis not present

## 2020-01-05 DIAGNOSIS — J9621 Acute and chronic respiratory failure with hypoxia: Secondary | ICD-10-CM

## 2020-01-05 DIAGNOSIS — I2510 Atherosclerotic heart disease of native coronary artery without angina pectoris: Secondary | ICD-10-CM

## 2020-01-05 DIAGNOSIS — J449 Chronic obstructive pulmonary disease, unspecified: Secondary | ICD-10-CM

## 2020-01-06 DIAGNOSIS — J449 Chronic obstructive pulmonary disease, unspecified: Secondary | ICD-10-CM

## 2020-01-06 DIAGNOSIS — J9621 Acute and chronic respiratory failure with hypoxia: Secondary | ICD-10-CM | POA: Diagnosis not present

## 2020-01-06 DIAGNOSIS — U071 COVID-19: Secondary | ICD-10-CM | POA: Diagnosis not present

## 2020-01-06 DIAGNOSIS — I2510 Atherosclerotic heart disease of native coronary artery without angina pectoris: Secondary | ICD-10-CM

## 2020-01-14 DIAGNOSIS — J9621 Acute and chronic respiratory failure with hypoxia: Secondary | ICD-10-CM

## 2020-01-14 DIAGNOSIS — J449 Chronic obstructive pulmonary disease, unspecified: Secondary | ICD-10-CM

## 2020-01-14 DIAGNOSIS — U071 COVID-19: Secondary | ICD-10-CM

## 2020-01-14 DIAGNOSIS — I2510 Atherosclerotic heart disease of native coronary artery without angina pectoris: Secondary | ICD-10-CM

## 2020-01-15 DIAGNOSIS — I2510 Atherosclerotic heart disease of native coronary artery without angina pectoris: Secondary | ICD-10-CM | POA: Diagnosis not present

## 2020-01-15 DIAGNOSIS — J9621 Acute and chronic respiratory failure with hypoxia: Secondary | ICD-10-CM | POA: Diagnosis not present

## 2020-01-15 DIAGNOSIS — J449 Chronic obstructive pulmonary disease, unspecified: Secondary | ICD-10-CM | POA: Diagnosis not present

## 2020-01-15 DIAGNOSIS — U071 COVID-19: Secondary | ICD-10-CM | POA: Diagnosis not present

## 2020-01-16 DIAGNOSIS — I2510 Atherosclerotic heart disease of native coronary artery without angina pectoris: Secondary | ICD-10-CM

## 2020-01-16 DIAGNOSIS — J9621 Acute and chronic respiratory failure with hypoxia: Secondary | ICD-10-CM | POA: Diagnosis not present

## 2020-01-16 DIAGNOSIS — J449 Chronic obstructive pulmonary disease, unspecified: Secondary | ICD-10-CM | POA: Diagnosis not present

## 2020-01-16 DIAGNOSIS — U071 COVID-19: Secondary | ICD-10-CM

## 2020-01-17 DIAGNOSIS — I2510 Atherosclerotic heart disease of native coronary artery without angina pectoris: Secondary | ICD-10-CM | POA: Diagnosis not present

## 2020-01-17 DIAGNOSIS — U071 COVID-19: Secondary | ICD-10-CM | POA: Diagnosis not present

## 2020-01-17 DIAGNOSIS — J449 Chronic obstructive pulmonary disease, unspecified: Secondary | ICD-10-CM | POA: Diagnosis not present

## 2020-01-17 DIAGNOSIS — J9621 Acute and chronic respiratory failure with hypoxia: Secondary | ICD-10-CM

## 2020-01-18 DIAGNOSIS — J9621 Acute and chronic respiratory failure with hypoxia: Secondary | ICD-10-CM

## 2020-01-18 DIAGNOSIS — U071 COVID-19: Secondary | ICD-10-CM | POA: Diagnosis not present

## 2020-01-18 DIAGNOSIS — J449 Chronic obstructive pulmonary disease, unspecified: Secondary | ICD-10-CM | POA: Diagnosis not present

## 2020-01-18 DIAGNOSIS — I2510 Atherosclerotic heart disease of native coronary artery without angina pectoris: Secondary | ICD-10-CM

## 2020-01-19 DIAGNOSIS — J9621 Acute and chronic respiratory failure with hypoxia: Secondary | ICD-10-CM | POA: Diagnosis not present

## 2020-01-19 DIAGNOSIS — I2510 Atherosclerotic heart disease of native coronary artery without angina pectoris: Secondary | ICD-10-CM | POA: Diagnosis not present

## 2020-01-19 DIAGNOSIS — J449 Chronic obstructive pulmonary disease, unspecified: Secondary | ICD-10-CM | POA: Diagnosis not present

## 2020-01-19 DIAGNOSIS — U071 COVID-19: Secondary | ICD-10-CM | POA: Diagnosis not present

## 2020-01-20 DIAGNOSIS — J9621 Acute and chronic respiratory failure with hypoxia: Secondary | ICD-10-CM | POA: Diagnosis not present

## 2020-01-20 DIAGNOSIS — J449 Chronic obstructive pulmonary disease, unspecified: Secondary | ICD-10-CM | POA: Diagnosis not present

## 2020-01-20 DIAGNOSIS — I2510 Atherosclerotic heart disease of native coronary artery without angina pectoris: Secondary | ICD-10-CM | POA: Diagnosis not present

## 2020-01-20 DIAGNOSIS — U071 COVID-19: Secondary | ICD-10-CM | POA: Diagnosis not present

## 2020-01-28 DIAGNOSIS — U071 COVID-19: Secondary | ICD-10-CM

## 2020-01-28 DIAGNOSIS — J9621 Acute and chronic respiratory failure with hypoxia: Secondary | ICD-10-CM

## 2020-01-28 DIAGNOSIS — I2510 Atherosclerotic heart disease of native coronary artery without angina pectoris: Secondary | ICD-10-CM

## 2020-01-28 DIAGNOSIS — J449 Chronic obstructive pulmonary disease, unspecified: Secondary | ICD-10-CM | POA: Diagnosis not present

## 2020-01-29 DIAGNOSIS — I2510 Atherosclerotic heart disease of native coronary artery without angina pectoris: Secondary | ICD-10-CM | POA: Diagnosis not present

## 2020-01-29 DIAGNOSIS — J449 Chronic obstructive pulmonary disease, unspecified: Secondary | ICD-10-CM

## 2020-01-29 DIAGNOSIS — J9621 Acute and chronic respiratory failure with hypoxia: Secondary | ICD-10-CM

## 2020-01-29 DIAGNOSIS — U071 COVID-19: Secondary | ICD-10-CM

## 2020-01-30 DIAGNOSIS — I2510 Atherosclerotic heart disease of native coronary artery without angina pectoris: Secondary | ICD-10-CM

## 2020-01-30 DIAGNOSIS — J9621 Acute and chronic respiratory failure with hypoxia: Secondary | ICD-10-CM | POA: Diagnosis not present

## 2020-01-30 DIAGNOSIS — J449 Chronic obstructive pulmonary disease, unspecified: Secondary | ICD-10-CM | POA: Diagnosis not present

## 2020-01-30 DIAGNOSIS — U071 COVID-19: Secondary | ICD-10-CM

## 2020-01-31 DIAGNOSIS — U071 COVID-19: Secondary | ICD-10-CM | POA: Diagnosis not present

## 2020-01-31 DIAGNOSIS — J449 Chronic obstructive pulmonary disease, unspecified: Secondary | ICD-10-CM | POA: Diagnosis not present

## 2020-01-31 DIAGNOSIS — I2510 Atherosclerotic heart disease of native coronary artery without angina pectoris: Secondary | ICD-10-CM | POA: Diagnosis not present

## 2020-01-31 DIAGNOSIS — J9621 Acute and chronic respiratory failure with hypoxia: Secondary | ICD-10-CM | POA: Diagnosis not present

## 2020-02-01 DIAGNOSIS — U071 COVID-19: Secondary | ICD-10-CM

## 2020-02-01 DIAGNOSIS — J9621 Acute and chronic respiratory failure with hypoxia: Secondary | ICD-10-CM

## 2020-02-01 DIAGNOSIS — J449 Chronic obstructive pulmonary disease, unspecified: Secondary | ICD-10-CM

## 2020-02-01 DIAGNOSIS — I2510 Atherosclerotic heart disease of native coronary artery without angina pectoris: Secondary | ICD-10-CM

## 2020-02-02 DIAGNOSIS — J449 Chronic obstructive pulmonary disease, unspecified: Secondary | ICD-10-CM | POA: Diagnosis not present

## 2020-02-02 DIAGNOSIS — J9621 Acute and chronic respiratory failure with hypoxia: Secondary | ICD-10-CM

## 2020-02-02 DIAGNOSIS — I2510 Atherosclerotic heart disease of native coronary artery without angina pectoris: Secondary | ICD-10-CM | POA: Diagnosis not present

## 2020-02-02 DIAGNOSIS — U071 COVID-19: Secondary | ICD-10-CM | POA: Diagnosis not present

## 2020-02-03 DIAGNOSIS — J449 Chronic obstructive pulmonary disease, unspecified: Secondary | ICD-10-CM

## 2020-02-03 DIAGNOSIS — I2510 Atherosclerotic heart disease of native coronary artery without angina pectoris: Secondary | ICD-10-CM | POA: Diagnosis not present

## 2020-02-03 DIAGNOSIS — U071 COVID-19: Secondary | ICD-10-CM

## 2020-02-03 DIAGNOSIS — J9621 Acute and chronic respiratory failure with hypoxia: Secondary | ICD-10-CM

## 2020-02-11 DIAGNOSIS — U071 COVID-19: Secondary | ICD-10-CM | POA: Diagnosis not present

## 2020-02-11 DIAGNOSIS — I2510 Atherosclerotic heart disease of native coronary artery without angina pectoris: Secondary | ICD-10-CM

## 2020-02-11 DIAGNOSIS — J449 Chronic obstructive pulmonary disease, unspecified: Secondary | ICD-10-CM

## 2020-02-11 DIAGNOSIS — J9621 Acute and chronic respiratory failure with hypoxia: Secondary | ICD-10-CM | POA: Diagnosis not present

## 2020-02-12 DIAGNOSIS — I2510 Atherosclerotic heart disease of native coronary artery without angina pectoris: Secondary | ICD-10-CM | POA: Diagnosis not present

## 2020-02-12 DIAGNOSIS — U071 COVID-19: Secondary | ICD-10-CM

## 2020-02-12 DIAGNOSIS — J9621 Acute and chronic respiratory failure with hypoxia: Secondary | ICD-10-CM

## 2020-02-12 DIAGNOSIS — J449 Chronic obstructive pulmonary disease, unspecified: Secondary | ICD-10-CM | POA: Diagnosis not present

## 2020-02-13 DIAGNOSIS — J9621 Acute and chronic respiratory failure with hypoxia: Secondary | ICD-10-CM | POA: Diagnosis not present

## 2020-02-13 DIAGNOSIS — I2510 Atherosclerotic heart disease of native coronary artery without angina pectoris: Secondary | ICD-10-CM | POA: Diagnosis not present

## 2020-02-13 DIAGNOSIS — U071 COVID-19: Secondary | ICD-10-CM | POA: Diagnosis not present

## 2020-02-13 DIAGNOSIS — J449 Chronic obstructive pulmonary disease, unspecified: Secondary | ICD-10-CM

## 2020-02-14 DIAGNOSIS — I2510 Atherosclerotic heart disease of native coronary artery without angina pectoris: Secondary | ICD-10-CM

## 2020-02-14 DIAGNOSIS — J449 Chronic obstructive pulmonary disease, unspecified: Secondary | ICD-10-CM

## 2020-02-14 DIAGNOSIS — J9621 Acute and chronic respiratory failure with hypoxia: Secondary | ICD-10-CM

## 2020-02-14 DIAGNOSIS — U071 COVID-19: Secondary | ICD-10-CM

## 2020-02-15 DIAGNOSIS — J449 Chronic obstructive pulmonary disease, unspecified: Secondary | ICD-10-CM

## 2020-02-15 DIAGNOSIS — U071 COVID-19: Secondary | ICD-10-CM | POA: Diagnosis not present

## 2020-02-15 DIAGNOSIS — I2510 Atherosclerotic heart disease of native coronary artery without angina pectoris: Secondary | ICD-10-CM | POA: Diagnosis not present

## 2020-02-15 DIAGNOSIS — J9621 Acute and chronic respiratory failure with hypoxia: Secondary | ICD-10-CM

## 2020-02-16 DIAGNOSIS — J9621 Acute and chronic respiratory failure with hypoxia: Secondary | ICD-10-CM

## 2020-02-16 DIAGNOSIS — U071 COVID-19: Secondary | ICD-10-CM

## 2020-02-16 DIAGNOSIS — I2510 Atherosclerotic heart disease of native coronary artery without angina pectoris: Secondary | ICD-10-CM

## 2020-02-16 DIAGNOSIS — J449 Chronic obstructive pulmonary disease, unspecified: Secondary | ICD-10-CM

## 2020-02-17 DIAGNOSIS — J449 Chronic obstructive pulmonary disease, unspecified: Secondary | ICD-10-CM | POA: Diagnosis not present

## 2020-02-17 DIAGNOSIS — J9621 Acute and chronic respiratory failure with hypoxia: Secondary | ICD-10-CM

## 2020-02-17 DIAGNOSIS — U071 COVID-19: Secondary | ICD-10-CM | POA: Diagnosis not present

## 2020-02-17 DIAGNOSIS — I2510 Atherosclerotic heart disease of native coronary artery without angina pectoris: Secondary | ICD-10-CM

## 2020-02-23 DIAGNOSIS — J449 Chronic obstructive pulmonary disease, unspecified: Secondary | ICD-10-CM

## 2020-02-23 DIAGNOSIS — I2510 Atherosclerotic heart disease of native coronary artery without angina pectoris: Secondary | ICD-10-CM

## 2020-02-23 DIAGNOSIS — J9621 Acute and chronic respiratory failure with hypoxia: Secondary | ICD-10-CM

## 2020-02-23 DIAGNOSIS — U071 COVID-19: Secondary | ICD-10-CM

## 2020-02-24 DIAGNOSIS — J449 Chronic obstructive pulmonary disease, unspecified: Secondary | ICD-10-CM

## 2020-02-24 DIAGNOSIS — J9621 Acute and chronic respiratory failure with hypoxia: Secondary | ICD-10-CM

## 2020-02-24 DIAGNOSIS — I2510 Atherosclerotic heart disease of native coronary artery without angina pectoris: Secondary | ICD-10-CM

## 2020-02-24 DIAGNOSIS — U071 COVID-19: Secondary | ICD-10-CM

## 2020-02-25 DIAGNOSIS — U071 COVID-19: Secondary | ICD-10-CM

## 2020-02-25 DIAGNOSIS — J9621 Acute and chronic respiratory failure with hypoxia: Secondary | ICD-10-CM

## 2020-02-25 DIAGNOSIS — J449 Chronic obstructive pulmonary disease, unspecified: Secondary | ICD-10-CM

## 2020-02-25 DIAGNOSIS — I2510 Atherosclerotic heart disease of native coronary artery without angina pectoris: Secondary | ICD-10-CM

## 2020-02-26 DIAGNOSIS — I2510 Atherosclerotic heart disease of native coronary artery without angina pectoris: Secondary | ICD-10-CM

## 2020-02-26 DIAGNOSIS — J449 Chronic obstructive pulmonary disease, unspecified: Secondary | ICD-10-CM

## 2020-02-26 DIAGNOSIS — J9621 Acute and chronic respiratory failure with hypoxia: Secondary | ICD-10-CM

## 2020-02-26 DIAGNOSIS — U071 COVID-19: Secondary | ICD-10-CM

## 2020-02-27 DIAGNOSIS — J9621 Acute and chronic respiratory failure with hypoxia: Secondary | ICD-10-CM

## 2020-02-27 DIAGNOSIS — U071 COVID-19: Secondary | ICD-10-CM

## 2020-02-27 DIAGNOSIS — I2510 Atherosclerotic heart disease of native coronary artery without angina pectoris: Secondary | ICD-10-CM

## 2020-02-27 DIAGNOSIS — J449 Chronic obstructive pulmonary disease, unspecified: Secondary | ICD-10-CM

## 2020-02-28 DIAGNOSIS — J449 Chronic obstructive pulmonary disease, unspecified: Secondary | ICD-10-CM

## 2020-02-28 DIAGNOSIS — U071 COVID-19: Secondary | ICD-10-CM

## 2020-02-28 DIAGNOSIS — J9621 Acute and chronic respiratory failure with hypoxia: Secondary | ICD-10-CM

## 2020-02-28 DIAGNOSIS — I2510 Atherosclerotic heart disease of native coronary artery without angina pectoris: Secondary | ICD-10-CM

## 2020-02-29 DIAGNOSIS — I2510 Atherosclerotic heart disease of native coronary artery without angina pectoris: Secondary | ICD-10-CM

## 2020-02-29 DIAGNOSIS — J449 Chronic obstructive pulmonary disease, unspecified: Secondary | ICD-10-CM

## 2020-02-29 DIAGNOSIS — J9621 Acute and chronic respiratory failure with hypoxia: Secondary | ICD-10-CM

## 2020-02-29 DIAGNOSIS — U071 COVID-19: Secondary | ICD-10-CM

## 2020-03-10 DIAGNOSIS — J449 Chronic obstructive pulmonary disease, unspecified: Secondary | ICD-10-CM | POA: Diagnosis not present

## 2020-03-10 DIAGNOSIS — J9621 Acute and chronic respiratory failure with hypoxia: Secondary | ICD-10-CM | POA: Diagnosis not present

## 2020-03-10 DIAGNOSIS — I2510 Atherosclerotic heart disease of native coronary artery without angina pectoris: Secondary | ICD-10-CM | POA: Diagnosis not present

## 2020-03-10 DIAGNOSIS — U071 COVID-19: Secondary | ICD-10-CM | POA: Diagnosis not present

## 2020-03-11 DIAGNOSIS — I2510 Atherosclerotic heart disease of native coronary artery without angina pectoris: Secondary | ICD-10-CM | POA: Diagnosis not present

## 2020-03-11 DIAGNOSIS — J449 Chronic obstructive pulmonary disease, unspecified: Secondary | ICD-10-CM | POA: Diagnosis not present

## 2020-03-11 DIAGNOSIS — U071 COVID-19: Secondary | ICD-10-CM | POA: Diagnosis not present

## 2020-03-11 DIAGNOSIS — J9621 Acute and chronic respiratory failure with hypoxia: Secondary | ICD-10-CM | POA: Diagnosis not present

## 2020-03-12 DIAGNOSIS — U071 COVID-19: Secondary | ICD-10-CM | POA: Diagnosis not present

## 2020-03-12 DIAGNOSIS — I2510 Atherosclerotic heart disease of native coronary artery without angina pectoris: Secondary | ICD-10-CM | POA: Diagnosis not present

## 2020-03-12 DIAGNOSIS — J449 Chronic obstructive pulmonary disease, unspecified: Secondary | ICD-10-CM | POA: Diagnosis not present

## 2020-03-12 DIAGNOSIS — J9621 Acute and chronic respiratory failure with hypoxia: Secondary | ICD-10-CM | POA: Diagnosis not present

## 2020-03-13 DIAGNOSIS — J449 Chronic obstructive pulmonary disease, unspecified: Secondary | ICD-10-CM | POA: Diagnosis not present

## 2020-03-13 DIAGNOSIS — I2510 Atherosclerotic heart disease of native coronary artery without angina pectoris: Secondary | ICD-10-CM | POA: Diagnosis not present

## 2020-03-13 DIAGNOSIS — U071 COVID-19: Secondary | ICD-10-CM | POA: Diagnosis not present

## 2020-03-13 DIAGNOSIS — J9621 Acute and chronic respiratory failure with hypoxia: Secondary | ICD-10-CM | POA: Diagnosis not present

## 2020-03-14 DIAGNOSIS — J449 Chronic obstructive pulmonary disease, unspecified: Secondary | ICD-10-CM | POA: Diagnosis not present

## 2020-03-14 DIAGNOSIS — U071 COVID-19: Secondary | ICD-10-CM | POA: Diagnosis not present

## 2020-03-14 DIAGNOSIS — J9621 Acute and chronic respiratory failure with hypoxia: Secondary | ICD-10-CM | POA: Diagnosis not present

## 2020-03-14 DIAGNOSIS — I2510 Atherosclerotic heart disease of native coronary artery without angina pectoris: Secondary | ICD-10-CM | POA: Diagnosis not present

## 2020-03-15 DIAGNOSIS — J449 Chronic obstructive pulmonary disease, unspecified: Secondary | ICD-10-CM | POA: Diagnosis not present

## 2020-03-15 DIAGNOSIS — J9621 Acute and chronic respiratory failure with hypoxia: Secondary | ICD-10-CM | POA: Diagnosis not present

## 2020-03-15 DIAGNOSIS — I2510 Atherosclerotic heart disease of native coronary artery without angina pectoris: Secondary | ICD-10-CM | POA: Diagnosis not present

## 2020-03-15 DIAGNOSIS — U071 COVID-19: Secondary | ICD-10-CM | POA: Diagnosis not present

## 2020-03-16 DIAGNOSIS — U071 COVID-19: Secondary | ICD-10-CM | POA: Diagnosis not present

## 2020-03-16 DIAGNOSIS — J9621 Acute and chronic respiratory failure with hypoxia: Secondary | ICD-10-CM | POA: Diagnosis not present

## 2020-03-16 DIAGNOSIS — J449 Chronic obstructive pulmonary disease, unspecified: Secondary | ICD-10-CM | POA: Diagnosis not present

## 2020-03-16 DIAGNOSIS — I2510 Atherosclerotic heart disease of native coronary artery without angina pectoris: Secondary | ICD-10-CM | POA: Diagnosis not present

## 2020-03-24 DIAGNOSIS — I2510 Atherosclerotic heart disease of native coronary artery without angina pectoris: Secondary | ICD-10-CM

## 2020-03-24 DIAGNOSIS — J9621 Acute and chronic respiratory failure with hypoxia: Secondary | ICD-10-CM | POA: Diagnosis not present

## 2020-03-24 DIAGNOSIS — J449 Chronic obstructive pulmonary disease, unspecified: Secondary | ICD-10-CM

## 2020-03-24 DIAGNOSIS — U071 COVID-19: Secondary | ICD-10-CM | POA: Diagnosis not present

## 2020-03-25 DIAGNOSIS — J9621 Acute and chronic respiratory failure with hypoxia: Secondary | ICD-10-CM

## 2020-03-25 DIAGNOSIS — U071 COVID-19: Secondary | ICD-10-CM | POA: Diagnosis not present

## 2020-03-25 DIAGNOSIS — I2510 Atherosclerotic heart disease of native coronary artery without angina pectoris: Secondary | ICD-10-CM

## 2020-03-25 DIAGNOSIS — J449 Chronic obstructive pulmonary disease, unspecified: Secondary | ICD-10-CM

## 2020-03-26 DIAGNOSIS — J449 Chronic obstructive pulmonary disease, unspecified: Secondary | ICD-10-CM

## 2020-03-26 DIAGNOSIS — I2510 Atherosclerotic heart disease of native coronary artery without angina pectoris: Secondary | ICD-10-CM

## 2020-03-26 DIAGNOSIS — U071 COVID-19: Secondary | ICD-10-CM

## 2020-03-26 DIAGNOSIS — J9621 Acute and chronic respiratory failure with hypoxia: Secondary | ICD-10-CM

## 2020-03-27 DIAGNOSIS — J9621 Acute and chronic respiratory failure with hypoxia: Secondary | ICD-10-CM

## 2020-03-27 DIAGNOSIS — J449 Chronic obstructive pulmonary disease, unspecified: Secondary | ICD-10-CM

## 2020-03-27 DIAGNOSIS — I2510 Atherosclerotic heart disease of native coronary artery without angina pectoris: Secondary | ICD-10-CM | POA: Diagnosis not present

## 2020-03-27 DIAGNOSIS — U071 COVID-19: Secondary | ICD-10-CM | POA: Diagnosis not present

## 2020-03-28 DIAGNOSIS — J449 Chronic obstructive pulmonary disease, unspecified: Secondary | ICD-10-CM | POA: Diagnosis not present

## 2020-03-28 DIAGNOSIS — U071 COVID-19: Secondary | ICD-10-CM

## 2020-03-28 DIAGNOSIS — I2510 Atherosclerotic heart disease of native coronary artery without angina pectoris: Secondary | ICD-10-CM

## 2020-03-28 DIAGNOSIS — J9621 Acute and chronic respiratory failure with hypoxia: Secondary | ICD-10-CM

## 2020-03-29 DIAGNOSIS — U071 COVID-19: Secondary | ICD-10-CM

## 2020-03-29 DIAGNOSIS — J9621 Acute and chronic respiratory failure with hypoxia: Secondary | ICD-10-CM | POA: Diagnosis not present

## 2020-03-29 DIAGNOSIS — J449 Chronic obstructive pulmonary disease, unspecified: Secondary | ICD-10-CM

## 2020-03-29 DIAGNOSIS — I2510 Atherosclerotic heart disease of native coronary artery without angina pectoris: Secondary | ICD-10-CM

## 2020-03-30 DIAGNOSIS — J449 Chronic obstructive pulmonary disease, unspecified: Secondary | ICD-10-CM

## 2020-03-30 DIAGNOSIS — I2510 Atherosclerotic heart disease of native coronary artery without angina pectoris: Secondary | ICD-10-CM

## 2020-03-30 DIAGNOSIS — U071 COVID-19: Secondary | ICD-10-CM | POA: Diagnosis not present

## 2020-03-30 DIAGNOSIS — J9621 Acute and chronic respiratory failure with hypoxia: Secondary | ICD-10-CM | POA: Diagnosis not present

## 2020-04-07 DIAGNOSIS — J9621 Acute and chronic respiratory failure with hypoxia: Secondary | ICD-10-CM

## 2020-04-07 DIAGNOSIS — J449 Chronic obstructive pulmonary disease, unspecified: Secondary | ICD-10-CM

## 2020-04-07 DIAGNOSIS — U071 COVID-19: Secondary | ICD-10-CM

## 2020-04-07 DIAGNOSIS — I2510 Atherosclerotic heart disease of native coronary artery without angina pectoris: Secondary | ICD-10-CM

## 2020-04-08 DIAGNOSIS — J9621 Acute and chronic respiratory failure with hypoxia: Secondary | ICD-10-CM

## 2020-04-08 DIAGNOSIS — U071 COVID-19: Secondary | ICD-10-CM | POA: Diagnosis not present

## 2020-04-08 DIAGNOSIS — I2510 Atherosclerotic heart disease of native coronary artery without angina pectoris: Secondary | ICD-10-CM | POA: Diagnosis not present

## 2020-04-08 DIAGNOSIS — J449 Chronic obstructive pulmonary disease, unspecified: Secondary | ICD-10-CM | POA: Diagnosis not present

## 2020-04-09 DIAGNOSIS — J449 Chronic obstructive pulmonary disease, unspecified: Secondary | ICD-10-CM

## 2020-04-09 DIAGNOSIS — J9621 Acute and chronic respiratory failure with hypoxia: Secondary | ICD-10-CM | POA: Diagnosis not present

## 2020-04-09 DIAGNOSIS — I2510 Atherosclerotic heart disease of native coronary artery without angina pectoris: Secondary | ICD-10-CM

## 2020-04-09 DIAGNOSIS — U071 COVID-19: Secondary | ICD-10-CM

## 2020-04-10 DIAGNOSIS — I2510 Atherosclerotic heart disease of native coronary artery without angina pectoris: Secondary | ICD-10-CM

## 2020-04-10 DIAGNOSIS — U071 COVID-19: Secondary | ICD-10-CM

## 2020-04-10 DIAGNOSIS — J449 Chronic obstructive pulmonary disease, unspecified: Secondary | ICD-10-CM | POA: Diagnosis not present

## 2020-04-10 DIAGNOSIS — J9621 Acute and chronic respiratory failure with hypoxia: Secondary | ICD-10-CM

## 2020-04-11 DIAGNOSIS — I2510 Atherosclerotic heart disease of native coronary artery without angina pectoris: Secondary | ICD-10-CM | POA: Diagnosis not present

## 2020-04-11 DIAGNOSIS — U071 COVID-19: Secondary | ICD-10-CM

## 2020-04-11 DIAGNOSIS — J9621 Acute and chronic respiratory failure with hypoxia: Secondary | ICD-10-CM

## 2020-04-11 DIAGNOSIS — J449 Chronic obstructive pulmonary disease, unspecified: Secondary | ICD-10-CM | POA: Diagnosis not present

## 2020-04-12 DIAGNOSIS — J449 Chronic obstructive pulmonary disease, unspecified: Secondary | ICD-10-CM

## 2020-04-12 DIAGNOSIS — J9621 Acute and chronic respiratory failure with hypoxia: Secondary | ICD-10-CM | POA: Diagnosis not present

## 2020-04-12 DIAGNOSIS — I2510 Atherosclerotic heart disease of native coronary artery without angina pectoris: Secondary | ICD-10-CM

## 2020-04-12 DIAGNOSIS — U071 COVID-19: Secondary | ICD-10-CM | POA: Diagnosis not present

## 2020-04-13 DIAGNOSIS — J449 Chronic obstructive pulmonary disease, unspecified: Secondary | ICD-10-CM

## 2020-04-13 DIAGNOSIS — I2510 Atherosclerotic heart disease of native coronary artery without angina pectoris: Secondary | ICD-10-CM | POA: Diagnosis not present

## 2020-04-13 DIAGNOSIS — J9621 Acute and chronic respiratory failure with hypoxia: Secondary | ICD-10-CM

## 2020-04-13 DIAGNOSIS — U071 COVID-19: Secondary | ICD-10-CM | POA: Diagnosis not present

## 2020-04-21 DIAGNOSIS — J9621 Acute and chronic respiratory failure with hypoxia: Secondary | ICD-10-CM

## 2020-04-21 DIAGNOSIS — I2510 Atherosclerotic heart disease of native coronary artery without angina pectoris: Secondary | ICD-10-CM

## 2020-04-21 DIAGNOSIS — J449 Chronic obstructive pulmonary disease, unspecified: Secondary | ICD-10-CM | POA: Diagnosis not present

## 2020-04-21 DIAGNOSIS — U071 COVID-19: Secondary | ICD-10-CM | POA: Diagnosis not present

## 2020-04-22 DIAGNOSIS — U071 COVID-19: Secondary | ICD-10-CM | POA: Diagnosis not present

## 2020-04-22 DIAGNOSIS — J449 Chronic obstructive pulmonary disease, unspecified: Secondary | ICD-10-CM | POA: Diagnosis not present

## 2020-04-22 DIAGNOSIS — I2510 Atherosclerotic heart disease of native coronary artery without angina pectoris: Secondary | ICD-10-CM | POA: Diagnosis not present

## 2020-04-22 DIAGNOSIS — J9621 Acute and chronic respiratory failure with hypoxia: Secondary | ICD-10-CM

## 2020-04-23 DIAGNOSIS — U071 COVID-19: Secondary | ICD-10-CM

## 2020-04-23 DIAGNOSIS — J9621 Acute and chronic respiratory failure with hypoxia: Secondary | ICD-10-CM | POA: Diagnosis not present

## 2020-04-23 DIAGNOSIS — J449 Chronic obstructive pulmonary disease, unspecified: Secondary | ICD-10-CM | POA: Diagnosis not present

## 2020-04-23 DIAGNOSIS — I2510 Atherosclerotic heart disease of native coronary artery without angina pectoris: Secondary | ICD-10-CM | POA: Diagnosis not present

## 2020-04-24 DIAGNOSIS — I2510 Atherosclerotic heart disease of native coronary artery without angina pectoris: Secondary | ICD-10-CM | POA: Diagnosis not present

## 2020-04-24 DIAGNOSIS — J9621 Acute and chronic respiratory failure with hypoxia: Secondary | ICD-10-CM | POA: Diagnosis not present

## 2020-04-24 DIAGNOSIS — J449 Chronic obstructive pulmonary disease, unspecified: Secondary | ICD-10-CM | POA: Diagnosis not present

## 2020-04-24 DIAGNOSIS — U071 COVID-19: Secondary | ICD-10-CM | POA: Diagnosis not present

## 2020-04-25 DIAGNOSIS — J9621 Acute and chronic respiratory failure with hypoxia: Secondary | ICD-10-CM | POA: Diagnosis not present

## 2020-04-25 DIAGNOSIS — U071 COVID-19: Secondary | ICD-10-CM | POA: Diagnosis not present

## 2020-04-25 DIAGNOSIS — I2510 Atherosclerotic heart disease of native coronary artery without angina pectoris: Secondary | ICD-10-CM | POA: Diagnosis not present

## 2020-04-25 DIAGNOSIS — J449 Chronic obstructive pulmonary disease, unspecified: Secondary | ICD-10-CM | POA: Diagnosis not present

## 2020-04-26 DIAGNOSIS — U071 COVID-19: Secondary | ICD-10-CM | POA: Diagnosis not present

## 2020-04-26 DIAGNOSIS — J9621 Acute and chronic respiratory failure with hypoxia: Secondary | ICD-10-CM | POA: Diagnosis not present

## 2020-04-26 DIAGNOSIS — I2510 Atherosclerotic heart disease of native coronary artery without angina pectoris: Secondary | ICD-10-CM | POA: Diagnosis not present

## 2020-04-26 DIAGNOSIS — J449 Chronic obstructive pulmonary disease, unspecified: Secondary | ICD-10-CM | POA: Diagnosis not present

## 2020-04-27 DIAGNOSIS — J9621 Acute and chronic respiratory failure with hypoxia: Secondary | ICD-10-CM | POA: Diagnosis not present

## 2020-04-27 DIAGNOSIS — I2510 Atherosclerotic heart disease of native coronary artery without angina pectoris: Secondary | ICD-10-CM | POA: Diagnosis not present

## 2020-04-27 DIAGNOSIS — J449 Chronic obstructive pulmonary disease, unspecified: Secondary | ICD-10-CM | POA: Diagnosis not present

## 2020-04-27 DIAGNOSIS — U071 COVID-19: Secondary | ICD-10-CM | POA: Diagnosis not present

## 2020-05-05 DIAGNOSIS — J9621 Acute and chronic respiratory failure with hypoxia: Secondary | ICD-10-CM | POA: Diagnosis not present

## 2020-05-05 DIAGNOSIS — I2510 Atherosclerotic heart disease of native coronary artery without angina pectoris: Secondary | ICD-10-CM | POA: Diagnosis not present

## 2020-05-05 DIAGNOSIS — U071 COVID-19: Secondary | ICD-10-CM

## 2020-05-05 DIAGNOSIS — J449 Chronic obstructive pulmonary disease, unspecified: Secondary | ICD-10-CM

## 2020-05-06 DIAGNOSIS — I2510 Atherosclerotic heart disease of native coronary artery without angina pectoris: Secondary | ICD-10-CM | POA: Diagnosis not present

## 2020-05-06 DIAGNOSIS — U071 COVID-19: Secondary | ICD-10-CM | POA: Diagnosis not present

## 2020-05-06 DIAGNOSIS — J449 Chronic obstructive pulmonary disease, unspecified: Secondary | ICD-10-CM | POA: Diagnosis not present

## 2020-05-06 DIAGNOSIS — J9621 Acute and chronic respiratory failure with hypoxia: Secondary | ICD-10-CM | POA: Diagnosis not present

## 2020-05-07 DIAGNOSIS — U071 COVID-19: Secondary | ICD-10-CM

## 2020-05-07 DIAGNOSIS — I2510 Atherosclerotic heart disease of native coronary artery without angina pectoris: Secondary | ICD-10-CM | POA: Diagnosis not present

## 2020-05-07 DIAGNOSIS — J449 Chronic obstructive pulmonary disease, unspecified: Secondary | ICD-10-CM | POA: Diagnosis not present

## 2020-05-07 DIAGNOSIS — J9621 Acute and chronic respiratory failure with hypoxia: Secondary | ICD-10-CM | POA: Diagnosis not present

## 2020-05-08 DIAGNOSIS — J9621 Acute and chronic respiratory failure with hypoxia: Secondary | ICD-10-CM | POA: Diagnosis not present

## 2020-05-08 DIAGNOSIS — I2510 Atherosclerotic heart disease of native coronary artery without angina pectoris: Secondary | ICD-10-CM | POA: Diagnosis not present

## 2020-05-08 DIAGNOSIS — J449 Chronic obstructive pulmonary disease, unspecified: Secondary | ICD-10-CM | POA: Diagnosis not present

## 2020-05-08 DIAGNOSIS — U071 COVID-19: Secondary | ICD-10-CM | POA: Diagnosis not present

## 2020-05-09 DIAGNOSIS — U071 COVID-19: Secondary | ICD-10-CM | POA: Diagnosis not present

## 2020-05-09 DIAGNOSIS — I2510 Atherosclerotic heart disease of native coronary artery without angina pectoris: Secondary | ICD-10-CM | POA: Diagnosis not present

## 2020-05-09 DIAGNOSIS — J449 Chronic obstructive pulmonary disease, unspecified: Secondary | ICD-10-CM | POA: Diagnosis not present

## 2020-05-09 DIAGNOSIS — J9621 Acute and chronic respiratory failure with hypoxia: Secondary | ICD-10-CM | POA: Diagnosis not present

## 2020-05-10 DIAGNOSIS — J9621 Acute and chronic respiratory failure with hypoxia: Secondary | ICD-10-CM | POA: Diagnosis not present

## 2020-05-10 DIAGNOSIS — J449 Chronic obstructive pulmonary disease, unspecified: Secondary | ICD-10-CM | POA: Diagnosis not present

## 2020-05-10 DIAGNOSIS — I2510 Atherosclerotic heart disease of native coronary artery without angina pectoris: Secondary | ICD-10-CM | POA: Diagnosis not present

## 2020-05-10 DIAGNOSIS — U071 COVID-19: Secondary | ICD-10-CM | POA: Diagnosis not present

## 2020-05-11 DIAGNOSIS — U071 COVID-19: Secondary | ICD-10-CM | POA: Diagnosis not present

## 2020-05-11 DIAGNOSIS — J449 Chronic obstructive pulmonary disease, unspecified: Secondary | ICD-10-CM | POA: Diagnosis not present

## 2020-05-11 DIAGNOSIS — I2510 Atherosclerotic heart disease of native coronary artery without angina pectoris: Secondary | ICD-10-CM | POA: Diagnosis not present

## 2020-05-11 DIAGNOSIS — J9621 Acute and chronic respiratory failure with hypoxia: Secondary | ICD-10-CM | POA: Diagnosis not present

## 2020-05-19 DIAGNOSIS — J9621 Acute and chronic respiratory failure with hypoxia: Secondary | ICD-10-CM

## 2020-05-19 DIAGNOSIS — I2510 Atherosclerotic heart disease of native coronary artery without angina pectoris: Secondary | ICD-10-CM

## 2020-05-19 DIAGNOSIS — U071 COVID-19: Secondary | ICD-10-CM | POA: Diagnosis not present

## 2020-05-19 DIAGNOSIS — J449 Chronic obstructive pulmonary disease, unspecified: Secondary | ICD-10-CM

## 2020-05-20 DIAGNOSIS — I2510 Atherosclerotic heart disease of native coronary artery without angina pectoris: Secondary | ICD-10-CM

## 2020-05-20 DIAGNOSIS — U071 COVID-19: Secondary | ICD-10-CM

## 2020-05-20 DIAGNOSIS — J449 Chronic obstructive pulmonary disease, unspecified: Secondary | ICD-10-CM

## 2020-05-20 DIAGNOSIS — J9621 Acute and chronic respiratory failure with hypoxia: Secondary | ICD-10-CM | POA: Diagnosis not present

## 2020-05-21 DIAGNOSIS — J9621 Acute and chronic respiratory failure with hypoxia: Secondary | ICD-10-CM | POA: Diagnosis not present

## 2020-05-21 DIAGNOSIS — J449 Chronic obstructive pulmonary disease, unspecified: Secondary | ICD-10-CM | POA: Diagnosis not present

## 2020-05-21 DIAGNOSIS — I2510 Atherosclerotic heart disease of native coronary artery without angina pectoris: Secondary | ICD-10-CM | POA: Diagnosis not present

## 2020-05-21 DIAGNOSIS — U071 COVID-19: Secondary | ICD-10-CM | POA: Diagnosis not present

## 2020-05-22 DIAGNOSIS — J9621 Acute and chronic respiratory failure with hypoxia: Secondary | ICD-10-CM | POA: Diagnosis not present

## 2020-05-22 DIAGNOSIS — I2510 Atherosclerotic heart disease of native coronary artery without angina pectoris: Secondary | ICD-10-CM | POA: Diagnosis not present

## 2020-05-22 DIAGNOSIS — J449 Chronic obstructive pulmonary disease, unspecified: Secondary | ICD-10-CM | POA: Diagnosis not present

## 2020-05-22 DIAGNOSIS — U071 COVID-19: Secondary | ICD-10-CM | POA: Diagnosis not present

## 2020-05-23 DIAGNOSIS — J449 Chronic obstructive pulmonary disease, unspecified: Secondary | ICD-10-CM

## 2020-05-23 DIAGNOSIS — I2510 Atherosclerotic heart disease of native coronary artery without angina pectoris: Secondary | ICD-10-CM

## 2020-05-23 DIAGNOSIS — J9621 Acute and chronic respiratory failure with hypoxia: Secondary | ICD-10-CM | POA: Diagnosis not present

## 2020-05-23 DIAGNOSIS — U071 COVID-19: Secondary | ICD-10-CM

## 2020-05-24 DIAGNOSIS — J9621 Acute and chronic respiratory failure with hypoxia: Secondary | ICD-10-CM | POA: Diagnosis not present

## 2020-05-24 DIAGNOSIS — U071 COVID-19: Secondary | ICD-10-CM | POA: Diagnosis not present

## 2020-05-24 DIAGNOSIS — I2510 Atherosclerotic heart disease of native coronary artery without angina pectoris: Secondary | ICD-10-CM | POA: Diagnosis not present

## 2020-05-24 DIAGNOSIS — J449 Chronic obstructive pulmonary disease, unspecified: Secondary | ICD-10-CM

## 2020-05-25 DIAGNOSIS — I2510 Atherosclerotic heart disease of native coronary artery without angina pectoris: Secondary | ICD-10-CM | POA: Diagnosis not present

## 2020-05-25 DIAGNOSIS — J9621 Acute and chronic respiratory failure with hypoxia: Secondary | ICD-10-CM | POA: Diagnosis not present

## 2020-05-25 DIAGNOSIS — J449 Chronic obstructive pulmonary disease, unspecified: Secondary | ICD-10-CM

## 2020-05-25 DIAGNOSIS — U071 COVID-19: Secondary | ICD-10-CM | POA: Diagnosis not present

## 2020-06-02 DIAGNOSIS — J9621 Acute and chronic respiratory failure with hypoxia: Secondary | ICD-10-CM

## 2020-06-02 DIAGNOSIS — I2510 Atherosclerotic heart disease of native coronary artery without angina pectoris: Secondary | ICD-10-CM

## 2020-06-02 DIAGNOSIS — J449 Chronic obstructive pulmonary disease, unspecified: Secondary | ICD-10-CM

## 2020-06-02 DIAGNOSIS — U071 COVID-19: Secondary | ICD-10-CM

## 2020-06-03 DIAGNOSIS — J449 Chronic obstructive pulmonary disease, unspecified: Secondary | ICD-10-CM | POA: Diagnosis not present

## 2020-06-03 DIAGNOSIS — J9621 Acute and chronic respiratory failure with hypoxia: Secondary | ICD-10-CM

## 2020-06-03 DIAGNOSIS — U071 COVID-19: Secondary | ICD-10-CM | POA: Diagnosis not present

## 2020-06-03 DIAGNOSIS — I2510 Atherosclerotic heart disease of native coronary artery without angina pectoris: Secondary | ICD-10-CM | POA: Diagnosis not present

## 2020-06-04 DIAGNOSIS — J9621 Acute and chronic respiratory failure with hypoxia: Secondary | ICD-10-CM

## 2020-06-04 DIAGNOSIS — J449 Chronic obstructive pulmonary disease, unspecified: Secondary | ICD-10-CM | POA: Diagnosis not present

## 2020-06-04 DIAGNOSIS — U071 COVID-19: Secondary | ICD-10-CM

## 2020-06-04 DIAGNOSIS — I2510 Atherosclerotic heart disease of native coronary artery without angina pectoris: Secondary | ICD-10-CM | POA: Diagnosis not present

## 2020-06-05 DIAGNOSIS — J9621 Acute and chronic respiratory failure with hypoxia: Secondary | ICD-10-CM

## 2020-06-05 DIAGNOSIS — J449 Chronic obstructive pulmonary disease, unspecified: Secondary | ICD-10-CM

## 2020-06-05 DIAGNOSIS — U071 COVID-19: Secondary | ICD-10-CM

## 2020-06-05 DIAGNOSIS — I2510 Atherosclerotic heart disease of native coronary artery without angina pectoris: Secondary | ICD-10-CM

## 2020-06-06 DIAGNOSIS — U071 COVID-19: Secondary | ICD-10-CM

## 2020-06-06 DIAGNOSIS — J9621 Acute and chronic respiratory failure with hypoxia: Secondary | ICD-10-CM | POA: Diagnosis not present

## 2020-06-06 DIAGNOSIS — J449 Chronic obstructive pulmonary disease, unspecified: Secondary | ICD-10-CM

## 2020-06-06 DIAGNOSIS — I2510 Atherosclerotic heart disease of native coronary artery without angina pectoris: Secondary | ICD-10-CM

## 2020-06-07 DIAGNOSIS — J449 Chronic obstructive pulmonary disease, unspecified: Secondary | ICD-10-CM

## 2020-06-07 DIAGNOSIS — J9621 Acute and chronic respiratory failure with hypoxia: Secondary | ICD-10-CM | POA: Diagnosis not present

## 2020-06-07 DIAGNOSIS — I2510 Atherosclerotic heart disease of native coronary artery without angina pectoris: Secondary | ICD-10-CM

## 2020-06-07 DIAGNOSIS — U071 COVID-19: Secondary | ICD-10-CM

## 2020-06-08 DIAGNOSIS — I2510 Atherosclerotic heart disease of native coronary artery without angina pectoris: Secondary | ICD-10-CM | POA: Diagnosis not present

## 2020-06-08 DIAGNOSIS — J9621 Acute and chronic respiratory failure with hypoxia: Secondary | ICD-10-CM

## 2020-06-08 DIAGNOSIS — J449 Chronic obstructive pulmonary disease, unspecified: Secondary | ICD-10-CM

## 2020-06-08 DIAGNOSIS — U071 COVID-19: Secondary | ICD-10-CM

## 2020-06-16 DIAGNOSIS — U071 COVID-19: Secondary | ICD-10-CM | POA: Diagnosis not present

## 2020-06-16 DIAGNOSIS — I2510 Atherosclerotic heart disease of native coronary artery without angina pectoris: Secondary | ICD-10-CM | POA: Diagnosis not present

## 2020-06-16 DIAGNOSIS — J9621 Acute and chronic respiratory failure with hypoxia: Secondary | ICD-10-CM

## 2020-06-16 DIAGNOSIS — J449 Chronic obstructive pulmonary disease, unspecified: Secondary | ICD-10-CM

## 2020-06-17 DIAGNOSIS — U071 COVID-19: Secondary | ICD-10-CM

## 2020-06-17 DIAGNOSIS — J449 Chronic obstructive pulmonary disease, unspecified: Secondary | ICD-10-CM | POA: Diagnosis not present

## 2020-06-17 DIAGNOSIS — J9621 Acute and chronic respiratory failure with hypoxia: Secondary | ICD-10-CM | POA: Diagnosis not present

## 2020-06-17 DIAGNOSIS — I2510 Atherosclerotic heart disease of native coronary artery without angina pectoris: Secondary | ICD-10-CM

## 2020-06-18 DIAGNOSIS — I2510 Atherosclerotic heart disease of native coronary artery without angina pectoris: Secondary | ICD-10-CM

## 2020-06-18 DIAGNOSIS — J449 Chronic obstructive pulmonary disease, unspecified: Secondary | ICD-10-CM | POA: Diagnosis not present

## 2020-06-18 DIAGNOSIS — J9621 Acute and chronic respiratory failure with hypoxia: Secondary | ICD-10-CM

## 2020-06-18 DIAGNOSIS — U071 COVID-19: Secondary | ICD-10-CM | POA: Diagnosis not present

## 2020-06-19 DIAGNOSIS — J449 Chronic obstructive pulmonary disease, unspecified: Secondary | ICD-10-CM

## 2020-06-19 DIAGNOSIS — J9621 Acute and chronic respiratory failure with hypoxia: Secondary | ICD-10-CM

## 2020-06-19 DIAGNOSIS — I2510 Atherosclerotic heart disease of native coronary artery without angina pectoris: Secondary | ICD-10-CM

## 2020-06-19 DIAGNOSIS — U071 COVID-19: Secondary | ICD-10-CM | POA: Diagnosis not present

## 2020-06-20 DIAGNOSIS — I2510 Atherosclerotic heart disease of native coronary artery without angina pectoris: Secondary | ICD-10-CM

## 2020-06-20 DIAGNOSIS — U071 COVID-19: Secondary | ICD-10-CM | POA: Diagnosis not present

## 2020-06-20 DIAGNOSIS — J449 Chronic obstructive pulmonary disease, unspecified: Secondary | ICD-10-CM

## 2020-06-20 DIAGNOSIS — J9621 Acute and chronic respiratory failure with hypoxia: Secondary | ICD-10-CM

## 2020-06-21 DIAGNOSIS — J449 Chronic obstructive pulmonary disease, unspecified: Secondary | ICD-10-CM

## 2020-06-21 DIAGNOSIS — I2510 Atherosclerotic heart disease of native coronary artery without angina pectoris: Secondary | ICD-10-CM | POA: Diagnosis not present

## 2020-06-21 DIAGNOSIS — J9621 Acute and chronic respiratory failure with hypoxia: Secondary | ICD-10-CM | POA: Diagnosis not present

## 2020-06-21 DIAGNOSIS — U071 COVID-19: Secondary | ICD-10-CM | POA: Diagnosis not present

## 2020-06-22 DIAGNOSIS — U071 COVID-19: Secondary | ICD-10-CM | POA: Diagnosis not present

## 2020-06-22 DIAGNOSIS — J449 Chronic obstructive pulmonary disease, unspecified: Secondary | ICD-10-CM

## 2020-06-22 DIAGNOSIS — I2510 Atherosclerotic heart disease of native coronary artery without angina pectoris: Secondary | ICD-10-CM | POA: Diagnosis not present

## 2020-06-22 DIAGNOSIS — J9621 Acute and chronic respiratory failure with hypoxia: Secondary | ICD-10-CM

## 2020-06-28 DIAGNOSIS — J449 Chronic obstructive pulmonary disease, unspecified: Secondary | ICD-10-CM | POA: Diagnosis not present

## 2020-06-28 DIAGNOSIS — U071 COVID-19: Secondary | ICD-10-CM | POA: Diagnosis not present

## 2020-06-28 DIAGNOSIS — N183 Chronic kidney disease, stage 3 unspecified: Secondary | ICD-10-CM | POA: Diagnosis not present

## 2020-06-28 DIAGNOSIS — J9621 Acute and chronic respiratory failure with hypoxia: Secondary | ICD-10-CM | POA: Diagnosis not present

## 2020-06-29 DIAGNOSIS — N183 Chronic kidney disease, stage 3 unspecified: Secondary | ICD-10-CM | POA: Diagnosis not present

## 2020-06-29 DIAGNOSIS — J9621 Acute and chronic respiratory failure with hypoxia: Secondary | ICD-10-CM | POA: Diagnosis not present

## 2020-06-29 DIAGNOSIS — J449 Chronic obstructive pulmonary disease, unspecified: Secondary | ICD-10-CM | POA: Diagnosis not present

## 2020-06-29 DIAGNOSIS — U071 COVID-19: Secondary | ICD-10-CM | POA: Diagnosis not present

## 2020-06-30 DIAGNOSIS — J449 Chronic obstructive pulmonary disease, unspecified: Secondary | ICD-10-CM | POA: Diagnosis not present

## 2020-06-30 DIAGNOSIS — N183 Chronic kidney disease, stage 3 unspecified: Secondary | ICD-10-CM

## 2020-06-30 DIAGNOSIS — U071 COVID-19: Secondary | ICD-10-CM | POA: Diagnosis not present

## 2020-06-30 DIAGNOSIS — J9621 Acute and chronic respiratory failure with hypoxia: Secondary | ICD-10-CM | POA: Diagnosis not present

## 2020-07-01 DIAGNOSIS — I2510 Atherosclerotic heart disease of native coronary artery without angina pectoris: Secondary | ICD-10-CM | POA: Diagnosis not present

## 2020-07-01 DIAGNOSIS — J9621 Acute and chronic respiratory failure with hypoxia: Secondary | ICD-10-CM | POA: Diagnosis not present

## 2020-07-01 DIAGNOSIS — J449 Chronic obstructive pulmonary disease, unspecified: Secondary | ICD-10-CM

## 2020-07-01 DIAGNOSIS — U071 COVID-19: Secondary | ICD-10-CM | POA: Diagnosis not present

## 2020-07-02 DIAGNOSIS — J449 Chronic obstructive pulmonary disease, unspecified: Secondary | ICD-10-CM | POA: Diagnosis not present

## 2020-07-02 DIAGNOSIS — J9621 Acute and chronic respiratory failure with hypoxia: Secondary | ICD-10-CM | POA: Diagnosis not present

## 2020-07-02 DIAGNOSIS — I2510 Atherosclerotic heart disease of native coronary artery without angina pectoris: Secondary | ICD-10-CM | POA: Diagnosis not present

## 2020-07-02 DIAGNOSIS — U071 COVID-19: Secondary | ICD-10-CM | POA: Diagnosis not present

## 2020-07-03 DIAGNOSIS — I2510 Atherosclerotic heart disease of native coronary artery without angina pectoris: Secondary | ICD-10-CM | POA: Diagnosis not present

## 2020-07-03 DIAGNOSIS — J9621 Acute and chronic respiratory failure with hypoxia: Secondary | ICD-10-CM

## 2020-07-03 DIAGNOSIS — J449 Chronic obstructive pulmonary disease, unspecified: Secondary | ICD-10-CM

## 2020-07-03 DIAGNOSIS — U071 COVID-19: Secondary | ICD-10-CM

## 2020-07-04 DIAGNOSIS — J449 Chronic obstructive pulmonary disease, unspecified: Secondary | ICD-10-CM | POA: Diagnosis not present

## 2020-07-04 DIAGNOSIS — U071 COVID-19: Secondary | ICD-10-CM | POA: Diagnosis not present

## 2020-07-04 DIAGNOSIS — J9621 Acute and chronic respiratory failure with hypoxia: Secondary | ICD-10-CM

## 2020-07-04 DIAGNOSIS — I2510 Atherosclerotic heart disease of native coronary artery without angina pectoris: Secondary | ICD-10-CM | POA: Diagnosis not present

## 2020-07-05 DIAGNOSIS — J9621 Acute and chronic respiratory failure with hypoxia: Secondary | ICD-10-CM

## 2020-07-05 DIAGNOSIS — U071 COVID-19: Secondary | ICD-10-CM

## 2020-07-05 DIAGNOSIS — I2510 Atherosclerotic heart disease of native coronary artery without angina pectoris: Secondary | ICD-10-CM

## 2020-07-05 DIAGNOSIS — J449 Chronic obstructive pulmonary disease, unspecified: Secondary | ICD-10-CM | POA: Diagnosis not present

## 2020-07-06 DIAGNOSIS — U071 COVID-19: Secondary | ICD-10-CM | POA: Diagnosis not present

## 2020-07-06 DIAGNOSIS — I2510 Atherosclerotic heart disease of native coronary artery without angina pectoris: Secondary | ICD-10-CM | POA: Diagnosis not present

## 2020-07-06 DIAGNOSIS — J9621 Acute and chronic respiratory failure with hypoxia: Secondary | ICD-10-CM | POA: Diagnosis not present

## 2020-07-06 DIAGNOSIS — J449 Chronic obstructive pulmonary disease, unspecified: Secondary | ICD-10-CM | POA: Diagnosis not present

## 2020-07-14 DIAGNOSIS — U071 COVID-19: Secondary | ICD-10-CM

## 2020-07-14 DIAGNOSIS — I2510 Atherosclerotic heart disease of native coronary artery without angina pectoris: Secondary | ICD-10-CM

## 2020-07-14 DIAGNOSIS — J9621 Acute and chronic respiratory failure with hypoxia: Secondary | ICD-10-CM

## 2020-07-14 DIAGNOSIS — J449 Chronic obstructive pulmonary disease, unspecified: Secondary | ICD-10-CM

## 2020-07-15 DIAGNOSIS — J9621 Acute and chronic respiratory failure with hypoxia: Secondary | ICD-10-CM

## 2020-07-15 DIAGNOSIS — I2510 Atherosclerotic heart disease of native coronary artery without angina pectoris: Secondary | ICD-10-CM

## 2020-07-15 DIAGNOSIS — U071 COVID-19: Secondary | ICD-10-CM | POA: Diagnosis not present

## 2020-07-15 DIAGNOSIS — J449 Chronic obstructive pulmonary disease, unspecified: Secondary | ICD-10-CM

## 2020-07-16 DIAGNOSIS — U071 COVID-19: Secondary | ICD-10-CM | POA: Diagnosis not present

## 2020-07-16 DIAGNOSIS — J9621 Acute and chronic respiratory failure with hypoxia: Secondary | ICD-10-CM | POA: Diagnosis not present

## 2020-07-16 DIAGNOSIS — J449 Chronic obstructive pulmonary disease, unspecified: Secondary | ICD-10-CM

## 2020-07-16 DIAGNOSIS — I2510 Atherosclerotic heart disease of native coronary artery without angina pectoris: Secondary | ICD-10-CM

## 2020-07-17 DIAGNOSIS — J449 Chronic obstructive pulmonary disease, unspecified: Secondary | ICD-10-CM

## 2020-07-17 DIAGNOSIS — J9621 Acute and chronic respiratory failure with hypoxia: Secondary | ICD-10-CM

## 2020-07-17 DIAGNOSIS — U071 COVID-19: Secondary | ICD-10-CM

## 2020-07-17 DIAGNOSIS — I2510 Atherosclerotic heart disease of native coronary artery without angina pectoris: Secondary | ICD-10-CM

## 2020-07-19 DIAGNOSIS — J449 Chronic obstructive pulmonary disease, unspecified: Secondary | ICD-10-CM

## 2020-07-19 DIAGNOSIS — U071 COVID-19: Secondary | ICD-10-CM | POA: Diagnosis not present

## 2020-07-19 DIAGNOSIS — I2510 Atherosclerotic heart disease of native coronary artery without angina pectoris: Secondary | ICD-10-CM

## 2020-07-19 DIAGNOSIS — J9621 Acute and chronic respiratory failure with hypoxia: Secondary | ICD-10-CM

## 2020-07-20 DIAGNOSIS — J449 Chronic obstructive pulmonary disease, unspecified: Secondary | ICD-10-CM | POA: Diagnosis not present

## 2020-07-20 DIAGNOSIS — I2510 Atherosclerotic heart disease of native coronary artery without angina pectoris: Secondary | ICD-10-CM | POA: Diagnosis not present

## 2020-07-20 DIAGNOSIS — U071 COVID-19: Secondary | ICD-10-CM | POA: Diagnosis not present

## 2020-07-20 DIAGNOSIS — J9621 Acute and chronic respiratory failure with hypoxia: Secondary | ICD-10-CM

## 2020-07-28 DIAGNOSIS — I2510 Atherosclerotic heart disease of native coronary artery without angina pectoris: Secondary | ICD-10-CM

## 2020-07-28 DIAGNOSIS — J9621 Acute and chronic respiratory failure with hypoxia: Secondary | ICD-10-CM | POA: Diagnosis not present

## 2020-07-28 DIAGNOSIS — J449 Chronic obstructive pulmonary disease, unspecified: Secondary | ICD-10-CM

## 2020-07-28 DIAGNOSIS — U071 COVID-19: Secondary | ICD-10-CM

## 2020-07-29 DIAGNOSIS — J9621 Acute and chronic respiratory failure with hypoxia: Secondary | ICD-10-CM

## 2020-07-29 DIAGNOSIS — J449 Chronic obstructive pulmonary disease, unspecified: Secondary | ICD-10-CM | POA: Diagnosis not present

## 2020-07-29 DIAGNOSIS — I2510 Atherosclerotic heart disease of native coronary artery without angina pectoris: Secondary | ICD-10-CM | POA: Diagnosis not present

## 2020-07-29 DIAGNOSIS — U071 COVID-19: Secondary | ICD-10-CM

## 2020-07-30 DIAGNOSIS — U071 COVID-19: Secondary | ICD-10-CM | POA: Diagnosis not present

## 2020-07-30 DIAGNOSIS — I2510 Atherosclerotic heart disease of native coronary artery without angina pectoris: Secondary | ICD-10-CM

## 2020-07-30 DIAGNOSIS — J9621 Acute and chronic respiratory failure with hypoxia: Secondary | ICD-10-CM | POA: Diagnosis not present

## 2020-07-30 DIAGNOSIS — J449 Chronic obstructive pulmonary disease, unspecified: Secondary | ICD-10-CM | POA: Diagnosis not present

## 2020-07-31 DIAGNOSIS — I2510 Atherosclerotic heart disease of native coronary artery without angina pectoris: Secondary | ICD-10-CM | POA: Diagnosis not present

## 2020-07-31 DIAGNOSIS — J9621 Acute and chronic respiratory failure with hypoxia: Secondary | ICD-10-CM

## 2020-07-31 DIAGNOSIS — J449 Chronic obstructive pulmonary disease, unspecified: Secondary | ICD-10-CM | POA: Diagnosis not present

## 2020-07-31 DIAGNOSIS — U071 COVID-19: Secondary | ICD-10-CM

## 2020-08-11 DIAGNOSIS — J449 Chronic obstructive pulmonary disease, unspecified: Secondary | ICD-10-CM | POA: Diagnosis not present

## 2020-08-11 DIAGNOSIS — U071 COVID-19: Secondary | ICD-10-CM | POA: Diagnosis not present

## 2020-08-11 DIAGNOSIS — I2510 Atherosclerotic heart disease of native coronary artery without angina pectoris: Secondary | ICD-10-CM | POA: Diagnosis not present

## 2020-08-11 DIAGNOSIS — J9621 Acute and chronic respiratory failure with hypoxia: Secondary | ICD-10-CM | POA: Diagnosis not present

## 2020-08-12 DIAGNOSIS — J449 Chronic obstructive pulmonary disease, unspecified: Secondary | ICD-10-CM

## 2020-08-12 DIAGNOSIS — J9621 Acute and chronic respiratory failure with hypoxia: Secondary | ICD-10-CM | POA: Diagnosis not present

## 2020-08-12 DIAGNOSIS — U071 COVID-19: Secondary | ICD-10-CM | POA: Diagnosis not present

## 2020-08-12 DIAGNOSIS — I2510 Atherosclerotic heart disease of native coronary artery without angina pectoris: Secondary | ICD-10-CM | POA: Diagnosis not present

## 2020-08-13 DIAGNOSIS — I2510 Atherosclerotic heart disease of native coronary artery without angina pectoris: Secondary | ICD-10-CM

## 2020-08-13 DIAGNOSIS — J9621 Acute and chronic respiratory failure with hypoxia: Secondary | ICD-10-CM

## 2020-08-13 DIAGNOSIS — U071 COVID-19: Secondary | ICD-10-CM | POA: Diagnosis not present

## 2020-08-13 DIAGNOSIS — J449 Chronic obstructive pulmonary disease, unspecified: Secondary | ICD-10-CM | POA: Diagnosis not present

## 2020-08-14 DIAGNOSIS — J449 Chronic obstructive pulmonary disease, unspecified: Secondary | ICD-10-CM

## 2020-08-14 DIAGNOSIS — J9621 Acute and chronic respiratory failure with hypoxia: Secondary | ICD-10-CM | POA: Diagnosis not present

## 2020-08-14 DIAGNOSIS — I2510 Atherosclerotic heart disease of native coronary artery without angina pectoris: Secondary | ICD-10-CM | POA: Diagnosis not present

## 2020-08-14 DIAGNOSIS — U071 COVID-19: Secondary | ICD-10-CM

## 2020-08-15 DIAGNOSIS — J9621 Acute and chronic respiratory failure with hypoxia: Secondary | ICD-10-CM | POA: Diagnosis not present

## 2020-08-15 DIAGNOSIS — J449 Chronic obstructive pulmonary disease, unspecified: Secondary | ICD-10-CM | POA: Diagnosis not present

## 2020-08-15 DIAGNOSIS — I2510 Atherosclerotic heart disease of native coronary artery without angina pectoris: Secondary | ICD-10-CM

## 2020-08-15 DIAGNOSIS — U071 COVID-19: Secondary | ICD-10-CM

## 2020-08-16 DIAGNOSIS — J9621 Acute and chronic respiratory failure with hypoxia: Secondary | ICD-10-CM

## 2020-08-16 DIAGNOSIS — I2510 Atherosclerotic heart disease of native coronary artery without angina pectoris: Secondary | ICD-10-CM | POA: Diagnosis not present

## 2020-08-16 DIAGNOSIS — U071 COVID-19: Secondary | ICD-10-CM | POA: Diagnosis not present

## 2020-08-16 DIAGNOSIS — J449 Chronic obstructive pulmonary disease, unspecified: Secondary | ICD-10-CM | POA: Diagnosis not present

## 2020-08-17 DIAGNOSIS — I2510 Atherosclerotic heart disease of native coronary artery without angina pectoris: Secondary | ICD-10-CM

## 2020-08-17 DIAGNOSIS — J449 Chronic obstructive pulmonary disease, unspecified: Secondary | ICD-10-CM

## 2020-08-17 DIAGNOSIS — U071 COVID-19: Secondary | ICD-10-CM

## 2020-08-17 DIAGNOSIS — J9621 Acute and chronic respiratory failure with hypoxia: Secondary | ICD-10-CM

## 2020-08-25 DIAGNOSIS — U071 COVID-19: Secondary | ICD-10-CM

## 2020-08-25 DIAGNOSIS — J9621 Acute and chronic respiratory failure with hypoxia: Secondary | ICD-10-CM

## 2020-08-25 DIAGNOSIS — I2510 Atherosclerotic heart disease of native coronary artery without angina pectoris: Secondary | ICD-10-CM

## 2020-08-25 DIAGNOSIS — J449 Chronic obstructive pulmonary disease, unspecified: Secondary | ICD-10-CM

## 2020-08-26 DIAGNOSIS — I2510 Atherosclerotic heart disease of native coronary artery without angina pectoris: Secondary | ICD-10-CM

## 2020-08-26 DIAGNOSIS — J9621 Acute and chronic respiratory failure with hypoxia: Secondary | ICD-10-CM

## 2020-08-26 DIAGNOSIS — J449 Chronic obstructive pulmonary disease, unspecified: Secondary | ICD-10-CM

## 2020-08-26 DIAGNOSIS — U071 COVID-19: Secondary | ICD-10-CM

## 2020-08-27 DIAGNOSIS — J449 Chronic obstructive pulmonary disease, unspecified: Secondary | ICD-10-CM

## 2020-08-27 DIAGNOSIS — U071 COVID-19: Secondary | ICD-10-CM

## 2020-08-27 DIAGNOSIS — J9621 Acute and chronic respiratory failure with hypoxia: Secondary | ICD-10-CM

## 2020-08-27 DIAGNOSIS — I2510 Atherosclerotic heart disease of native coronary artery without angina pectoris: Secondary | ICD-10-CM

## 2020-08-28 DIAGNOSIS — I2510 Atherosclerotic heart disease of native coronary artery without angina pectoris: Secondary | ICD-10-CM

## 2020-08-28 DIAGNOSIS — U071 COVID-19: Secondary | ICD-10-CM

## 2020-08-28 DIAGNOSIS — J449 Chronic obstructive pulmonary disease, unspecified: Secondary | ICD-10-CM

## 2020-08-28 DIAGNOSIS — J9621 Acute and chronic respiratory failure with hypoxia: Secondary | ICD-10-CM

## 2020-08-29 DIAGNOSIS — U071 COVID-19: Secondary | ICD-10-CM

## 2020-08-29 DIAGNOSIS — J449 Chronic obstructive pulmonary disease, unspecified: Secondary | ICD-10-CM

## 2020-08-29 DIAGNOSIS — I2510 Atherosclerotic heart disease of native coronary artery without angina pectoris: Secondary | ICD-10-CM

## 2020-08-29 DIAGNOSIS — J9621 Acute and chronic respiratory failure with hypoxia: Secondary | ICD-10-CM

## 2020-09-05 DIAGNOSIS — I2510 Atherosclerotic heart disease of native coronary artery without angina pectoris: Secondary | ICD-10-CM | POA: Diagnosis not present

## 2020-09-05 DIAGNOSIS — J9621 Acute and chronic respiratory failure with hypoxia: Secondary | ICD-10-CM | POA: Diagnosis not present

## 2020-09-05 DIAGNOSIS — J449 Chronic obstructive pulmonary disease, unspecified: Secondary | ICD-10-CM | POA: Diagnosis not present

## 2020-09-05 DIAGNOSIS — U071 COVID-19: Secondary | ICD-10-CM | POA: Diagnosis not present

## 2020-09-06 DIAGNOSIS — U071 COVID-19: Secondary | ICD-10-CM | POA: Diagnosis not present

## 2020-09-06 DIAGNOSIS — J449 Chronic obstructive pulmonary disease, unspecified: Secondary | ICD-10-CM | POA: Diagnosis not present

## 2020-09-06 DIAGNOSIS — I2510 Atherosclerotic heart disease of native coronary artery without angina pectoris: Secondary | ICD-10-CM | POA: Diagnosis not present

## 2020-09-06 DIAGNOSIS — J9621 Acute and chronic respiratory failure with hypoxia: Secondary | ICD-10-CM | POA: Diagnosis not present

## 2020-09-07 DIAGNOSIS — J449 Chronic obstructive pulmonary disease, unspecified: Secondary | ICD-10-CM

## 2020-09-07 DIAGNOSIS — U071 COVID-19: Secondary | ICD-10-CM

## 2020-09-07 DIAGNOSIS — I2510 Atherosclerotic heart disease of native coronary artery without angina pectoris: Secondary | ICD-10-CM

## 2020-09-07 DIAGNOSIS — J9621 Acute and chronic respiratory failure with hypoxia: Secondary | ICD-10-CM

## 2020-09-08 DIAGNOSIS — J9621 Acute and chronic respiratory failure with hypoxia: Secondary | ICD-10-CM

## 2020-09-08 DIAGNOSIS — J449 Chronic obstructive pulmonary disease, unspecified: Secondary | ICD-10-CM

## 2020-09-08 DIAGNOSIS — U071 COVID-19: Secondary | ICD-10-CM

## 2020-09-08 DIAGNOSIS — I2510 Atherosclerotic heart disease of native coronary artery without angina pectoris: Secondary | ICD-10-CM

## 2020-09-10 DIAGNOSIS — J449 Chronic obstructive pulmonary disease, unspecified: Secondary | ICD-10-CM

## 2020-09-10 DIAGNOSIS — I2510 Atherosclerotic heart disease of native coronary artery without angina pectoris: Secondary | ICD-10-CM

## 2020-09-10 DIAGNOSIS — J9621 Acute and chronic respiratory failure with hypoxia: Secondary | ICD-10-CM

## 2020-09-10 DIAGNOSIS — U071 COVID-19: Secondary | ICD-10-CM

## 2020-09-11 DIAGNOSIS — I2510 Atherosclerotic heart disease of native coronary artery without angina pectoris: Secondary | ICD-10-CM

## 2020-09-11 DIAGNOSIS — J9621 Acute and chronic respiratory failure with hypoxia: Secondary | ICD-10-CM

## 2020-09-11 DIAGNOSIS — U071 COVID-19: Secondary | ICD-10-CM

## 2020-09-11 DIAGNOSIS — J449 Chronic obstructive pulmonary disease, unspecified: Secondary | ICD-10-CM

## 2020-09-12 ENCOUNTER — Other Ambulatory Visit: Payer: Self-pay | Admitting: Orthopaedic Surgery

## 2020-09-12 DIAGNOSIS — J449 Chronic obstructive pulmonary disease, unspecified: Secondary | ICD-10-CM

## 2020-09-12 DIAGNOSIS — U071 COVID-19: Secondary | ICD-10-CM

## 2020-09-12 DIAGNOSIS — I2510 Atherosclerotic heart disease of native coronary artery without angina pectoris: Secondary | ICD-10-CM

## 2020-09-12 DIAGNOSIS — J9621 Acute and chronic respiratory failure with hypoxia: Secondary | ICD-10-CM

## 2020-09-13 DIAGNOSIS — I2510 Atherosclerotic heart disease of native coronary artery without angina pectoris: Secondary | ICD-10-CM

## 2020-09-13 DIAGNOSIS — J9621 Acute and chronic respiratory failure with hypoxia: Secondary | ICD-10-CM

## 2020-09-13 DIAGNOSIS — U071 COVID-19: Secondary | ICD-10-CM

## 2020-09-13 DIAGNOSIS — J449 Chronic obstructive pulmonary disease, unspecified: Secondary | ICD-10-CM

## 2020-09-14 ENCOUNTER — Encounter (HOSPITAL_COMMUNITY): Payer: Self-pay | Admitting: Orthopaedic Surgery

## 2020-09-14 NOTE — Progress Notes (Addendum)
Same Day Workup INSTRUCTIONS:  Your procedure is scheduled on Sep 16, 2020. Please report to [facility] Main Entrance "A" at 1:00 PM, and check in at the Admitting office. Call this number if you have problems the morning of surgery: [phone]   Remember: Do not eat after midnight the night before your surgery  You may drink clear liquids until 12:30 the afternoon of your surgery.   Clear liquids allowed are: Water, Non-Citrus Juices (without pulp), Carbonated Beverages, Clear Tea, Black Coffee Only, and Gatorade   Medications to take morning of surgery with a sip of water include: Baclofen (Lioresal) Gabapentin (Neurontin) Gemfibrozil (Lopid)   WHAT DO I DO ABOUT MY DIABETES MEDICATION?  Marland Kitchen Do not take oral diabetes medicines (pills) the morning of surgery.  . THE NIGHT BEFORE SURGERY, take a HALF DOSE of your Insulin Glargine (LANTUS) which would be 11 units.  . THE MORNING OF SURGERY: If your CBG is greater than 220 mg/dL, you may take  of your sliding scale (correction) dose of insulin.   HOW TO MANAGE YOUR DIABETES BEFORE AND AFTER SURGERY  Why is it important to control my blood sugar before and after surgery? . Improving blood sugar levels before and after surgery helps healing and can limit problems. . A way of improving blood sugar control is eating a healthy diet by: o  Eating less sugar and carbohydrates o  Increasing activity/exercise o  Talking with your doctor about reaching your blood sugar goals . High blood sugars (greater than 180 mg/dL) can raise your risk of infections and slow your recovery, so you will need to focus on controlling your diabetes during the weeks before surgery. . Make sure that the doctor who takes care of your diabetes knows about your planned surgery including the date and location.  How do I manage my blood sugar before surgery? . Check your blood sugar at least 4 times a day, starting 2 days before surgery, to make sure that the level  is not too high or low. . Check your blood sugar the morning of your surgery when you wake up and every 2 hours until you get to the Short Stay unit. o If your blood sugar is less than 70 mg/dL, you will need to treat for low blood sugar: - Do not take insulin. - Treat a low blood sugar (less than 70 mg/dL) with  cup of clear juice (cranberry or apple), 4 glucose tablets, OR glucose gel. - Recheck blood sugar in 15 minutes after treatment (to make sure it is greater than 70 mg/dL). If your blood sugar is not greater than 70 mg/dL on recheck, call [phone] for further instructions. . Report your blood sugar to the short stay nurse when you get to Short Stay.  . If you are admitted to the hospital after surgery: o Your blood sugar will be checked by the staff and you will probably be given insulin after surgery (instead of oral diabetes medicines) to make sure you have good blood sugar levels. o The goal for blood sugar control after surgery is 80-180 mg/dL.    As of today, STOP taking any Aspirin (unless otherwise instructed by your surgeon), Aleve, Naproxen, Ibuprofen, Motrin, Advil, Goody's, BC's, all herbal medications, fish oil, and all vitamins.    The Morning of Surgery Do not wear jewelry. Do not wear lotions, powders, or perfumes/colognes, or deodorant Do not shave 48 hours prior to surgery.   Men may shave face and neck. Do not  bring valuables to the hospital. [facility] is not responsible for any belongings or valuables. If you are a smoker, DO NOT Smoke 24 hours prior to surgery If you wear a CPAP at night please bring your mask the morning of surgery  Remember that you must have someone to transport you home after your surgery, and remain with you for 24 hours if you are discharged the same day. Please bring cases for contacts, glasses, hearing aids, dentures or bridgework because it cannot be worn into surgery.   Patients discharged the day of surgery will not be  allowed to drive home.   Please shower the NIGHT BEFORE SURGERY and the MORNING OF SURGERY with DIAL Soap. Wear comfortable clothes the morning of surgery. Oral Hygiene is also important to reduce your risk of infection.  Remember - BRUSH YOUR TEETH THE MORNING OF SURGERY WITH YOUR REGULAR TOOTHPASTE

## 2020-09-15 ENCOUNTER — Other Ambulatory Visit: Payer: Self-pay

## 2020-09-15 ENCOUNTER — Other Ambulatory Visit: Payer: Self-pay | Admitting: Orthopaedic Surgery

## 2020-09-15 ENCOUNTER — Encounter (HOSPITAL_COMMUNITY): Payer: Self-pay | Admitting: Orthopaedic Surgery

## 2020-09-15 NOTE — Anesthesia Preprocedure Evaluation (Addendum)
Anesthesia Evaluation  Patient identified by MRN, date of birth, ID band Patient awake    Reviewed: Allergy & Precautions, NPO status , Patient's Chart, lab work & pertinent test results  Airway Mallampati: III  TM Distance: >3 FB Neck ROM: Limited    Dental  (+) Missing   Pulmonary asthma , COPD (on 2-3L O2),  oxygen dependent,     + decreased breath sounds      Cardiovascular Exercise Tolerance: Poor hypertension, + CAD   Rhythm:Regular Rate:Tachycardia  Sinus tachycardia with Premature supraventricular complexes Septal infarct , age undetermined Abnormal ECG   Neuro/Psych PSYCHIATRIC DISORDERS Depression Wheelchair bound  Neuromuscular disease (peripheral neuropathy )    GI/Hepatic GERD  ,  Endo/Other  diabetes, Type 2, Insulin Dependent  Renal/GU Renal InsufficiencyRenal disease  negative genitourinary   Musculoskeletal  (+) Arthritis , Osteoarthritis,    Abdominal (+) + obese,   Peds  Hematology  (+) anemia ,   Anesthesia Other Findings Bilateral upper and lower extremity tremors/jerking (involuntary)  Reproductive/Obstetrics negative OB ROS                          Anesthesia Physical Anesthesia Plan  ASA: IV  Anesthesia Plan: Regional and MAC   Post-op Pain Management:  Regional for Post-op pain and GA combined w/ Regional for post-op pain   Induction: Intravenous  PONV Risk Score and Plan: Propofol infusion, TIVA and Treatment may vary due to age or medical condition  Airway Management Planned: Natural Airway and Simple Face Mask  Additional Equipment: None  Intra-op Plan:   Post-operative Plan:   Informed Consent: I have reviewed the patients History and Physical, chart, labs and discussed the procedure including the risks, benefits and alternatives for the proposed anesthesia with the patient or authorized representative who has indicated his/her understanding and  acceptance.     Dental advisory given  Plan Discussed with: CRNA and Anesthesiologist  Anesthesia Plan Comments: (Propofol TIVA. Pop/Saph block. GA/LMA as backup plan. Norton Blizzard, MD    PAT note by Karoline Caldwell, PA-C: Patient currently resides at [facility] in [place].  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  He was previously at [facility] floor for continuous management of his medical problems and chronic debility.  He does have history of CAD s/p STEMI 06/2008 treated with stent x3 to LAD, CKD 3, COPD with chronic respiratory failure on 2-3 L continuous O2, history of bladder cancer s/p chemoradiation in 2010, IDDM 2.  Per available notes, he is wheelchair-bound.  Per notes in Care Everywhere, he was last seen by cardiology at [facility] on 10/15/2015 for preop evaluation prior to undergoing bilateral ureteral stent replacement.  Nuclear stress test was ordered.  Test done 10/23/2015 showed fixed defect involving the apical anterior, mid anterior, apical septal and mid anteroseptal segments consistent with scar.  There was no significant ischemia noted on perfusion imaging.  EF calculated at 55%.  No significant change compared to prior study from 07/2013.  Patient will need day of surgery labs and evaluation.  Nuclear stress 10/23/2015 (Care Everywhere): - Abnormal myocardial perfusion study  - No significant ischemia is noted onperfusion imaging  - There is a moderate in size, moderate in severity, fixed defect  involving the apical anterior, mid anterior, apical septal and mid  anteroseptal segments. This is consistent with scar.  - Post stress:  Global systolic function is normal. The ejection fraction  calculated at 55%.  - No significant change when compared to prior study from 07/2013  - Post PCI changes and bilateral emphysematous changes of lung are noted  on the attenuation CT    TTE  12/12/2012 (Care Everywhere): Clinical Diagnoses and Echocardiographic Findings    Contractile left ventricular dysfunction (mild)    Decreased left ventricular ejection fraction (67-12%)    Diastolic left ventricular dysfunction    Degenerative mitral valve disease    Aortic sclerosis    Dilated thoracic aorta (see detail below)    Normal right ventricular contractile performance    Tricuspid regurgitation (trivial)    No detectable vegetations (see detail below)   Descriptive Comments - Left Ventricle    The left ventricle is normal in size with normal wall thickness and    mildly decreased contraction.    The visually estimated left ventricular ejection fraction is 45-50%.    Diastolic transmitral flow profile and mitral annular tissue Doppler    signal characteristic of impaired left ventricular relaxation.  Mitral Valve    The mitral valve is mildly thickened with normal leaflet mobility.    There is no echocardiographic evidence of pathologic mitral    regurgitation by color flow or continuous wave Doppler imaging.  Left Atrium    The left atrium is upper normal in size.  Aortic Valve    The aortic valve is trileaflet and mildly thickened with normal    excursion.    There is no echocardiographic evidence of aortic regurgitation by    color flow or continuous wave Doppler examinations.    There is no evidence of hemodynamically important aortic transvalvular    gradient; the maximal instantaneous left ventricular outflow velocity    is 1.1 m/s.  Aorta   The thoracic aorta is mildly dilated measuring 3.9 cm at the sinuses    of Valsalva; the transverse arch is suboptimally imaged.  Pulmonary Artery    The pulmonary artery is suboptimally imaged.  Pulmonic Valve    The pulmonary valve is suboptimally imaged.    There is no detectable  pulmonary regurgitation by color flow Doppler    imaging.    The Doppler signal is of suboptimal technical quality; the right    ventricular outflow tract gradient cannot be accuratelyestimated from    this examination.  Right Ventricle    There is normal right ventricular chamber size and contraction.  Tricuspid Valve    The tricuspid valve is structurally normal.    There is trivial tricuspid regurgitation by colorflow and continuous    wave Doppler imaging.    The signal is of suboptimal technical quality; right ventricular and    pulmonary arterial systolic pressure cannot be accurately estimated    from this examination.  Right Atrium    The right atrium is normal in size.  Inferior Vena Cava    Suboptimally imaged inferior vena cava is probably normal in size with    physiologic phasic respiratory variation.  Pericardium    There is no evidence of pericardial effusion.  Other    There are no vegetations detected but native valvular disease is    present; clinical information, correlation and complementary    diagnostic studies are required to confirm or exclude the diagnosis of    infective endocarditis.   )      Anesthesia Quick Evaluation

## 2020-09-15 NOTE — Progress Notes (Signed)
Same Day Workup INSTRUCTIONS:  Your procedure is scheduled on Sep 16, 2020. Please report to [facility] Main Entrance "A" at 12:30 PM, and check in at the Admitting office. Call this number if you have problems the morning of surgery: [phone]  >>>>>Please send patient's Medication Record with medications administrated documentation. ( this information is required prior to OR. This includes medications that may have been on hold for surgery)<<<<<  Remember: Do not eat after midnight the night before your surgery  You may drink clear liquids until 12:30 the afternoon of your surgery.   Clear liquids allowed are: Water, Non-Citrus Juices (without pulp), Carbonated Beverages, Clear Tea, Black Coffee Only, and Gatorade   Medications to take morning of surgery with a sip of water include: Baclofen (Lioresal) Gabapentin (Neurontin) Gemfibrozil (Lopid)   WHAT DO I DO ABOUT MY DIABETES MEDICATION?  Marland Kitchen Do not take oral diabetes medicines (pills) the morning of surgery.  . THE NIGHT BEFORE SURGERY, take a HALF DOSE of your Insulin Glargine (LANTUS) which would be 11 units.  . THE MORNING OF SURGERY: If your CBG is greater than 220 mg/dL, you may take  of your sliding scale (correction) dose of insulin. .  . Check  blood sugar the morning of your surgery when you wake up and every 2 hours until you get to the Short Stay unit. o If your blood sugar is less than 70 mg/dL, you will need to treat for low blood sugar: - Per [facility] Protocol. -  Recheck blood sugar in 15 minutes after treatment (to make sure it is greater than 70 mg/dL). If your blood sugar is not greater than 70 mg/dL on recheck, call [phone] for further    As of today, STOP taking any Aspirin (unless otherwise instructed by your surgeon), Aleve, Naproxen, Ibuprofen, Motrin, Advil, Goody's, BC's, all herbal medications, fish oil, and all vitamins.    The Morning of Surgery Do not wear jewelry. Do not wear lotions,  powders, or perfumes/colognes, or deodorant Do not shave 48 hours prior to surgery.   Men may shave face and neck. Do not bring valuables to the hospital. [facility] is not responsible for any belongings or valuables. If you are a smoker, DO NOT Smoke 24 hours prior to surgery If you wear a CPAP at night please bring your mask the morning of surgery  Please bring cases for contacts, glasses, hearing aids, dentures or bridgework because it cannot be worn into surgery.   Patients discharged the day of surgery will not be allowed to drive home.   Please shower the MORNING OF SURGERY with DIAL Soap or any antibiotic soap. Wear comfortable clothes the morning of surgery. Oral Hygiene is also important to reduce your risk of infection.  Remember - BRUSH YOUR TEETH THE MORNING OF SURGERY WITH YOUR REGULAR TOOTHPASTE

## 2020-09-15 NOTE — Progress Notes (Addendum)
I spoke with Keith Hidden, RN at [facility].  Keith Ward will be arriving at 0830 on Tuesday, May 17, Patient will have Covid test on arrival. Keith Ward has been in [facility] for over a year, was admitted for Covid, and there are no beds to transfer patient to at Facility patient was prior.  Keith Ward has a ankle fracture, he is non weight bearing.  Per Keith Hidden, RN, Keith Ward has COPD, patient wears O2 - 24/7 at 2- 3 liters. Keith Ward reports that patien this doing well, no acute respiratory  Issues.  Keith Ward has a history of CAD, patient had stents placed , Keith Ward blood pressure runs 120/60 - 140/80; heart rate- runs in the 90's.  Keith Ward has type II diabetes, CBG's over few days has ran 217- 250. I instructed Keith Cancer, RN, to give Keith Ward 1/2 of Lantus bedtime dose 11 units. I instructed to check CBG after awaking and every 2 hours until arrival  to the hospital.  If CBG is less than 70 to follow Keith Ward  protocol for NPO patients.  Recheck CBG in 15 minutes if CBG is not over 70 call, pre- op desk at [phone] for further instructions. If CBG is greater than 220- treat with  1/2 of Humolog sliding scale dose.   I fax instructions to [facility] attention Keith Ward. I spoke to Keith Ward after she received the fax and told her that [name] may be given Mucinex also in am.  I asked Keith Ward to send patient H/P. I asked anesthesiology PA-C to evaluate.  I left a voice  Message for Keith Ward, Keith Ward scheduler asking which consent do we need to use , there are two. I also asked Keith Ward if there should be an antibiotic order.  I received the H/P from Keith Ward- it is from May 2021, the Dr's note has that Keith Ward is on ASA, it is not on the confirmed Medication Record by Home Medication Medication Record. I called , left a voice  Message for Keith Ward requesting a Medication record to be faxed to Keith Ward.  I  called around 0800 this am and left a voice message for [name] for Keith Ward scheduler. I asked if patient should have an antibiotic and which OR consent is the one to [place] for tomorrow's surgery.  I explain that the consent's are the same, except has possible Lisfranc.  I received a call from [name], charge nurse, question regarding Lantus to take tonight, patient takes 24 units in am not 22 and  at hs. I requested again that patient's Medication List be sent to [name].  I instructed to give 12 units of Lantus am if CBG is greater than 70.  I sent [name] a staff message requesting a site written for procedure, right is listed on the reason.

## 2020-09-15 NOTE — Progress Notes (Signed)
Anesthesia Chart Review: Same day workup  Patient currently resides at [facility] in [place].  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  He was previously at [facility] floor for continuous management of his medical problems and chronic debility.  He does have history of CAD s/p STEMI 06/2008 treated with stent x3 to LAD, CKD 3, COPD with chronic respiratory failure on 2-3 L continuous O2, history of bladder cancer s/p chemoradiation in 2010, IDDM 2.  Per available notes, he is wheelchair-bound.  Per notes in Care Everywhere, he was last seen by cardiology at [facility] on 10/15/2015 for preop evaluation prior to undergoing bilateral ureteral stent replacement.  Nuclear stress test was ordered.  Test done 10/23/2015 showed fixed defect involving the apical anterior, mid anterior, apical septal and mid anteroseptal segments consistent with scar.  There was no significant ischemia noted on perfusion imaging.  EF calculated at 55%.  No significant change compared to prior study from 07/2013.  Patient will need day of surgery labs and evaluation.  Nuclear stress 10/23/2015 (Care Everywhere): - Abnormal myocardial perfusion study  - No significant ischemia is noted onperfusion imaging  - There is a moderate in size, moderate in severity, fixed defect  involving the apical anterior, mid anterior, apical septal and mid  anteroseptal segments. This is consistent with scar.  - Post stress: Global systolic function is normal. The ejection fraction  calculated at 55%.  - No significant change when compared to prior study from 07/2013  - Post PCI changes and bilateral emphysematous changes of lung are noted  on the attenuation CT    TTE 12/12/2012 (Care Everywhere): Clinical Diagnoses and Echocardiographic Findings    Contractile left ventricular dysfunction (mild)    Decreased left ventricular ejection  fraction (29-51%)    Diastolic left ventricular dysfunction    Degenerative mitral valve disease    Aortic sclerosis    Dilated thoracic aorta (see detail below)    Normal right ventricular contractile performance    Tricuspid regurgitation (trivial)    No detectable vegetations (see detail below)   Descriptive Comments - Left Ventricle    The left ventricle is normal in size with normal wall thickness and    mildly decreased contraction.    The visually estimated left ventricular ejection fraction is 45-50%.    Diastolic transmitral flow profile and mitral annular tissue Doppler    signal characteristic of impaired left ventricular relaxation.  Mitral Valve    The mitral valve is mildly thickened with normal leaflet mobility.    There is no echocardiographic evidence of pathologic mitral    regurgitation by color flow or continuous wave Doppler imaging.  Left Atrium    The left atrium is upper normal in size.  Aortic Valve    The aortic valve is trileaflet and mildly thickened with normal    excursion.    There is no echocardiographic evidence of aortic regurgitation by    color flow or continuous wave Doppler examinations.    There is no evidence of hemodynamically important aortic transvalvular    gradient; the maximal instantaneous left ventricular outflow velocity    is 1.1 m/s.  Aorta   The thoracic aorta is mildly dilated measuring 3.9 cm at the sinuses    of Valsalva; the transverse arch is suboptimally imaged.  Pulmonary Artery    The pulmonary artery is suboptimally imaged.  Pulmonic Valve  The pulmonary valve is suboptimally imaged.    There is no detectable pulmonary regurgitation by color flow Doppler    imaging.    The Doppler signal is of suboptimal technical quality; the right    ventricular outflow tract gradient  cannot be accuratelyestimated from    this examination.  Right Ventricle    There is normal right ventricular chamber size and contraction.  Tricuspid Valve    The tricuspid valve is structurally normal.    There is trivial tricuspid regurgitation by colorflow and continuous    wave Doppler imaging.    The signal is of suboptimal technical quality; right ventricular and    pulmonary arterial systolic pressure cannot be accurately estimated    from this examination.  Right Atrium    The right atrium is normal in size.  Inferior Vena Cava    Suboptimally imaged inferior vena cava is probably normal in size with    physiologic phasic respiratory variation.  Pericardium    There is no evidence of pericardial effusion.  Other    There are no vegetations detected but native valvular disease is    present; clinical information, correlation and complementary    diagnostic studies are required to confirm or exclude the diagnosis of    infective endocarditis.   Wynonia Musty [facility]/Anesthesiology Phone [phone] 09/15/2020 1:18 PM

## 2020-09-16 ENCOUNTER — Ambulatory Visit (HOSPITAL_COMMUNITY): Admitting: Physician Assistant

## 2020-09-16 ENCOUNTER — Ambulatory Visit (HOSPITAL_COMMUNITY)
Admission: RE | Admit: 2020-09-16 | Discharge: 2020-09-16 | Disposition: A | Discharge status: Other Acute Inpatient Hospital | Source: Ambulatory Visit | Attending: Orthopaedic Surgery | Admitting: Orthopaedic Surgery

## 2020-09-16 ENCOUNTER — Encounter (HOSPITAL_COMMUNITY): Payer: Self-pay | Admitting: Orthopaedic Surgery

## 2020-09-16 ENCOUNTER — Encounter (HOSPITAL_COMMUNITY)
Admission: RE | Disposition: A | Discharge status: Nursing Home (Includes ICF) | Payer: Self-pay | Source: Ambulatory Visit | Attending: Orthopaedic Surgery

## 2020-09-16 ENCOUNTER — Ambulatory Visit (HOSPITAL_COMMUNITY)

## 2020-09-16 ENCOUNTER — Other Ambulatory Visit: Payer: Self-pay

## 2020-09-16 DIAGNOSIS — E7800 Pure hypercholesterolemia, unspecified: Secondary | ICD-10-CM | POA: Diagnosis not present

## 2020-09-16 DIAGNOSIS — N189 Chronic kidney disease, unspecified: Secondary | ICD-10-CM | POA: Insufficient documentation

## 2020-09-16 DIAGNOSIS — S93431A Sprain of tibiofibular ligament of right ankle, initial encounter: Secondary | ICD-10-CM | POA: Diagnosis not present

## 2020-09-16 DIAGNOSIS — I2510 Atherosclerotic heart disease of native coronary artery without angina pectoris: Secondary | ICD-10-CM | POA: Insufficient documentation

## 2020-09-16 DIAGNOSIS — Z955 Presence of coronary angioplasty implant and graft: Secondary | ICD-10-CM | POA: Insufficient documentation

## 2020-09-16 DIAGNOSIS — I129 Hypertensive chronic kidney disease with stage 1 through stage 4 chronic kidney disease, or unspecified chronic kidney disease: Secondary | ICD-10-CM | POA: Diagnosis not present

## 2020-09-16 DIAGNOSIS — E785 Hyperlipidemia, unspecified: Secondary | ICD-10-CM | POA: Insufficient documentation

## 2020-09-16 DIAGNOSIS — Z79899 Other long term (current) drug therapy: Secondary | ICD-10-CM | POA: Insufficient documentation

## 2020-09-16 DIAGNOSIS — X501XXA Overexertion from prolonged static or awkward postures, initial encounter: Secondary | ICD-10-CM | POA: Diagnosis not present

## 2020-09-16 DIAGNOSIS — E1122 Type 2 diabetes mellitus with diabetic chronic kidney disease: Secondary | ICD-10-CM | POA: Insufficient documentation

## 2020-09-16 DIAGNOSIS — Z419 Encounter for procedure for purposes other than remedying health state, unspecified: Secondary | ICD-10-CM

## 2020-09-16 DIAGNOSIS — Z794 Long term (current) use of insulin: Secondary | ICD-10-CM | POA: Diagnosis not present

## 2020-09-16 DIAGNOSIS — E1142 Type 2 diabetes mellitus with diabetic polyneuropathy: Secondary | ICD-10-CM | POA: Diagnosis not present

## 2020-09-16 DIAGNOSIS — I429 Cardiomyopathy, unspecified: Secondary | ICD-10-CM | POA: Diagnosis not present

## 2020-09-16 HISTORY — DX: Type 2 diabetes mellitus without complications: E11.9

## 2020-09-16 HISTORY — DX: Unspecified asthma, uncomplicated: J45.909

## 2020-09-16 HISTORY — DX: Personal history of urinary (tract) infections: Z87.440

## 2020-09-16 HISTORY — DX: Personal history of urinary calculi: Z87.442

## 2020-09-16 HISTORY — DX: Depression, unspecified: F32.A

## 2020-09-16 HISTORY — DX: Personal history of other diseases of urinary system: Z87.448

## 2020-09-16 HISTORY — DX: Atherosclerotic heart disease of native coronary artery without angina pectoris: I25.10

## 2020-09-16 HISTORY — DX: Essential (primary) hypertension: I10

## 2020-09-16 HISTORY — DX: Polyneuropathy, unspecified: G62.9

## 2020-09-16 HISTORY — DX: Cardiomyopathy, unspecified: I42.9

## 2020-09-16 HISTORY — DX: Malignant neoplasm of bladder, unspecified: C67.9

## 2020-09-16 HISTORY — DX: Benign prostatic hyperplasia without lower urinary tract symptoms: N40.0

## 2020-09-16 HISTORY — DX: Chronic obstructive pulmonary disease, unspecified: J44.9

## 2020-09-16 HISTORY — PX: ORIF FIBULA FRACTURE: SHX5114

## 2020-09-16 HISTORY — DX: Gastro-esophageal reflux disease without esophagitis: K21.9

## 2020-09-16 HISTORY — DX: Hyperlipidemia, unspecified: E78.5

## 2020-09-16 HISTORY — DX: Personal history of other diseases of the musculoskeletal system and connective tissue: Z87.39

## 2020-09-16 HISTORY — DX: Nicotine dependence, unspecified, uncomplicated: F17.200

## 2020-09-16 HISTORY — DX: Anemia, unspecified: D64.9

## 2020-09-16 HISTORY — DX: Pure hypercholesterolemia, unspecified: E78.00

## 2020-09-16 HISTORY — DX: Restless legs syndrome: G25.81

## 2020-09-16 HISTORY — DX: Chronic kidney disease, unspecified: N18.9

## 2020-09-16 LAB — CBC
HCT: 32.6 % — ABNORMAL LOW (ref 39.0–52.0)
Hemoglobin: 9.9 g/dL — ABNORMAL LOW (ref 13.0–17.0)
MCH: 21.6 pg — ABNORMAL LOW (ref 26.0–34.0)
MCHC: 30.4 g/dL (ref 30.0–36.0)
MCV: 71 fL — ABNORMAL LOW (ref 80.0–100.0)
Platelets: 587 10*3/uL — ABNORMAL HIGH (ref 150–400)
RBC: 4.59 MIL/uL (ref 4.22–5.81)
RDW: 16.7 % — ABNORMAL HIGH (ref 11.5–15.5)
WBC: 15.6 10*3/uL — ABNORMAL HIGH (ref 4.0–10.5)
nRBC: 0 % (ref 0.0–0.2)

## 2020-09-16 LAB — GLUCOSE, CAPILLARY
Glucose-Capillary: 135 mg/dL — ABNORMAL HIGH (ref 70–99)
Glucose-Capillary: 158 mg/dL — ABNORMAL HIGH (ref 70–99)

## 2020-09-16 LAB — BASIC METABOLIC PANEL
Anion gap: 12 (ref 5–15)
BUN: 69 mg/dL — ABNORMAL HIGH (ref 8–23)
CO2: 24 mmol/L (ref 22–32)
Calcium: 9.4 mg/dL (ref 8.9–10.3)
Chloride: 106 mmol/L (ref 98–111)
Creatinine, Ser: 3.39 mg/dL — ABNORMAL HIGH (ref 0.61–1.24)
GFR, Estimated: 19 mL/min — ABNORMAL LOW (ref >60)
Glucose, Bld: 140 mg/dL — ABNORMAL HIGH (ref 70–99)
Potassium: 4.4 mmol/L (ref 3.5–5.1)
Sodium: 142 mmol/L (ref 135–145)

## 2020-09-16 SURGERY — OPEN REDUCTION INTERNAL FIXATION (ORIF) FIBULA FRACTURE
Anesthesia: Monitor Anesthesia Care | Site: Ankle | Laterality: Right

## 2020-09-16 MED ORDER — VANCOMYCIN HCL 1000 MG IV SOLR
INTRAVENOUS | Status: AC
  Filled 2020-09-16: qty 1000

## 2020-09-16 MED ORDER — CEFAZOLIN SODIUM-DEXTROSE 2-4 GM/100ML-% IV SOLN
2.0000 g | Freq: Once | INTRAVENOUS | Status: AC
  Administered 2020-09-16: 2 g via INTRAVENOUS

## 2020-09-16 MED ORDER — CHLORHEXIDINE GLUCONATE 0.12 % MT SOLN
OROMUCOSAL | Status: AC
  Administered 2020-09-16: 15 mL via OROMUCOSAL
  Filled 2020-09-16: qty 15

## 2020-09-16 MED ORDER — PHENYLEPHRINE HCL-NACL 10-0.9 MG/250ML-% IV SOLN
INTRAVENOUS | Status: DC | PRN
  Administered 2020-09-16: 25 ug/min via INTRAVENOUS

## 2020-09-16 MED ORDER — LIDOCAINE 2% (20 MG/ML) 5 ML SYRINGE
INTRAMUSCULAR | Status: DC | PRN
  Administered 2020-09-16: 100 mg via INTRAVENOUS

## 2020-09-16 MED ORDER — FENTANYL CITRATE (PF) 100 MCG/2ML IJ SOLN
INTRAMUSCULAR | Status: AC
  Administered 2020-09-16: 50 ug via INTRAVENOUS
  Filled 2020-09-16: qty 2

## 2020-09-16 MED ORDER — PROPOFOL 500 MG/50ML IV EMUL
INTRAVENOUS | Status: DC | PRN
  Administered 2020-09-16: 200 ug/kg/min via INTRAVENOUS

## 2020-09-16 MED ORDER — PROPOFOL 10 MG/ML IV BOLUS
INTRAVENOUS | Status: AC
  Filled 2020-09-16: qty 20

## 2020-09-16 MED ORDER — LACTATED RINGERS IV SOLN: INTRAVENOUS | Status: DC | PRN

## 2020-09-16 MED ORDER — PROPOFOL 1000 MG/100ML IV EMUL
INTRAVENOUS | Status: AC
  Filled 2020-09-16: qty 100

## 2020-09-16 MED ORDER — ORAL CARE MOUTH RINSE: 15.0000 mL | Freq: Once | OROMUCOSAL | Status: AC

## 2020-09-16 MED ORDER — PHENYLEPHRINE 40 MCG/ML (10ML) SYRINGE FOR IV PUSH (FOR BLOOD PRESSURE SUPPORT)
PREFILLED_SYRINGE | INTRAVENOUS | Status: DC | PRN
  Administered 2020-09-16 (×2): 200 ug via INTRAVENOUS

## 2020-09-16 MED ORDER — OXYCODONE HCL 5 MG PO TABS: 5.0000 mg | ORAL_TABLET | ORAL | 0 refills | Status: AC | PRN

## 2020-09-16 MED ORDER — LACTATED RINGERS IV SOLN: INTRAVENOUS | Status: DC

## 2020-09-16 MED ORDER — FENTANYL CITRATE (PF) 100 MCG/2ML IJ SOLN: 50.0000 ug | Freq: Once | INTRAMUSCULAR | Status: AC

## 2020-09-16 MED ORDER — 0.9 % SODIUM CHLORIDE (POUR BTL) OPTIME
TOPICAL | Status: DC | PRN
  Administered 2020-09-16: 1000 mL

## 2020-09-16 MED ORDER — PHENYLEPHRINE 40 MCG/ML (10ML) SYRINGE FOR IV PUSH (FOR BLOOD PRESSURE SUPPORT): PREFILLED_SYRINGE | INTRAVENOUS | Status: DC | PRN

## 2020-09-16 MED ORDER — ASPIRIN 325 MG PO TABS: 325.0000 mg | ORAL_TABLET | Freq: Every day | ORAL | 11 refills | Status: AC

## 2020-09-16 MED ORDER — CHLORHEXIDINE GLUCONATE 0.12 % MT SOLN: 15.0000 mL | Freq: Once | OROMUCOSAL | Status: AC

## 2020-09-16 MED ORDER — VANCOMYCIN HCL 1000 MG IV SOLR
INTRAVENOUS | Status: DC | PRN
  Administered 2020-09-16: 1000 mg

## 2020-09-16 MED ORDER — EPHEDRINE SULFATE-NACL 50-0.9 MG/10ML-% IV SOSY
PREFILLED_SYRINGE | INTRAVENOUS | Status: DC | PRN
  Administered 2020-09-16: 10 mg via INTRAVENOUS

## 2020-09-16 MED ORDER — CEFAZOLIN SODIUM-DEXTROSE 2-4 GM/100ML-% IV SOLN
INTRAVENOUS | Status: AC
  Filled 2020-09-16: qty 100

## 2020-09-16 MED ORDER — MIDAZOLAM HCL 2 MG/2ML IJ SOLN
INTRAMUSCULAR | Status: AC
  Filled 2020-09-16: qty 2

## 2020-09-16 SURGICAL SUPPLY — 79 items
ALCOHOL 70% 16 OZ (MISCELLANEOUS) ×2 IMPLANT
BANDAGE ESMARK 6X9 LF (GAUZE/BANDAGES/DRESSINGS) IMPLANT
BIT DRILL 2.5X2.75 QC CALB (BIT) ×2 IMPLANT
BIT DRILL CALIBRATED 2.7 (BIT) ×2 IMPLANT
BLADE SURG 15 STRL LF DISP TIS (BLADE) ×1 IMPLANT
BLADE SURG 15 STRL SS (BLADE) ×1
BNDG COHESIVE 4X5 TAN STRL (GAUZE/BANDAGES/DRESSINGS) IMPLANT
BNDG COHESIVE 6X5 TAN STRL LF (GAUZE/BANDAGES/DRESSINGS) IMPLANT
BNDG ELASTIC 6X10 VLCR STRL LF (GAUZE/BANDAGES/DRESSINGS) ×2 IMPLANT
BNDG ESMARK 6X9 LF (GAUZE/BANDAGES/DRESSINGS)
CANISTER SUCT 3000ML PPV (MISCELLANEOUS) ×2 IMPLANT
CHLORAPREP W/TINT 26 (MISCELLANEOUS) ×4 IMPLANT
COVER SURGICAL LIGHT HANDLE (MISCELLANEOUS) ×2 IMPLANT
COVER WAND RF STERILE (DRAPES) ×2 IMPLANT
CUFF TOURN SGL QUICK 34 (TOURNIQUET CUFF) ×1
CUFF TOURN SGL QUICK 42 (TOURNIQUET CUFF) IMPLANT
CUFF TRNQT CYL 34X4.125X (TOURNIQUET CUFF) ×1 IMPLANT
DRAPE OEC MINIVIEW 54X84 (DRAPES) ×2 IMPLANT
DRAPE U-SHAPE 47X51 STRL (DRAPES) ×2 IMPLANT
DRSG MEPITEL 4X7.2 (GAUZE/BANDAGES/DRESSINGS) ×2 IMPLANT
DRSG PAD ABDOMINAL 8X10 ST (GAUZE/BANDAGES/DRESSINGS) ×4 IMPLANT
DRSG XEROFORM 1X8 (GAUZE/BANDAGES/DRESSINGS) ×2 IMPLANT
ELECT BLADE 4.0 EZ CLEAN MEGAD (MISCELLANEOUS) ×2
ELECT REM PT RETURN 9FT ADLT (ELECTROSURGICAL) ×2
ELECTRODE BLDE 4.0 EZ CLN MEGD (MISCELLANEOUS) ×1 IMPLANT
ELECTRODE REM PT RTRN 9FT ADLT (ELECTROSURGICAL) ×1 IMPLANT
GAUZE SPONGE 4X4 12PLY STRL (GAUZE/BANDAGES/DRESSINGS) IMPLANT
GAUZE SPONGE 4X4 12PLY STRL LF (GAUZE/BANDAGES/DRESSINGS) ×2 IMPLANT
GAUZE XEROFORM 1X8 LF (GAUZE/BANDAGES/DRESSINGS) ×2 IMPLANT
GAUZE XEROFORM 5X9 LF (GAUZE/BANDAGES/DRESSINGS) ×2 IMPLANT
GLOVE BIOGEL M STRL SZ7.5 (GLOVE) ×2 IMPLANT
GLOVE SRG 8 PF TXTR STRL LF DI (GLOVE) ×1 IMPLANT
GLOVE SURG UNDER POLY LF SZ8 (GLOVE) ×1
GOWN STRL REUS W/ TWL LRG LVL3 (GOWN DISPOSABLE) ×1 IMPLANT
GOWN STRL REUS W/ TWL XL LVL3 (GOWN DISPOSABLE) ×1 IMPLANT
GOWN STRL REUS W/TWL LRG LVL3 (GOWN DISPOSABLE) ×1
GOWN STRL REUS W/TWL XL LVL3 (GOWN DISPOSABLE) ×1
K-WIRE ACE 1.6X6 (WIRE) ×8
KIT BASIN OR (CUSTOM PROCEDURE TRAY) ×2 IMPLANT
KIT TURNOVER KIT B (KITS) ×2 IMPLANT
KWIRE ACE 1.6X6 (WIRE) ×4 IMPLANT
NS IRRIG 1000ML POUR BTL (IV SOLUTION) ×2 IMPLANT
PACK ORTHO EXTREMITY (CUSTOM PROCEDURE TRAY) ×2 IMPLANT
PAD ARMBOARD 7.5X6 YLW CONV (MISCELLANEOUS) ×4 IMPLANT
PAD CAST 4YDX4 CTTN HI CHSV (CAST SUPPLIES) ×1 IMPLANT
PADDING CAST COTTON 4X4 STRL (CAST SUPPLIES) ×1
PLATE ACE 100DE 10H (Plate) ×2 IMPLANT
PLATE LOCK 6H RT MED DIST TIB (Plate) ×2 IMPLANT
SCREW CORT FT 32X3.5XNONLOCK (Screw) ×1 IMPLANT
SCREW CORT T15 28X3.5XST LCK (Screw) ×2 IMPLANT
SCREW CORTICAL 3.5MM  16MM (Screw) ×3 IMPLANT
SCREW CORTICAL 3.5MM  20MM (Screw) ×1 IMPLANT
SCREW CORTICAL 3.5MM  32MM (Screw) ×1 IMPLANT
SCREW CORTICAL 3.5MM 14MM (Screw) ×4 IMPLANT
SCREW CORTICAL 3.5MM 16MM (Screw) ×3 IMPLANT
SCREW CORTICAL 3.5MM 20MM (Screw) ×1 IMPLANT
SCREW CORTICAL 3.5MM 40MM (Screw) ×2 IMPLANT
SCREW CORTICAL 3.5MM 48MM (Screw) ×2 IMPLANT
SCREW CORTICAL 3.5MM 50MM (Screw) ×2 IMPLANT
SCREW CORTICAL 3.5X28MM (Screw) ×2 IMPLANT
SCREW LOCK CORT STAR 3.5X36 (Screw) ×4 IMPLANT
SCREW LOCK CORT STAR 3.5X38 (Screw) ×2 IMPLANT
SCREW LOCK CORT STAR 3.5X40 (Screw) ×2 IMPLANT
SCREW LOCK CORT STAR 3.5X42 (Screw) ×2 IMPLANT
SCREW LOCK CORT STAR 3.5X44 (Screw) ×2 IMPLANT
SPONGE LAP 18X18 RF (DISPOSABLE) ×2 IMPLANT
STAPLER VISISTAT (STAPLE) ×2 IMPLANT
SUCTION FRAZIER HANDLE 10FR (MISCELLANEOUS) ×1
SUCTION TUBE FRAZIER 10FR DISP (MISCELLANEOUS) ×1 IMPLANT
SUT ETHILON 3 0 PS 1 (SUTURE) ×2 IMPLANT
SUT MON AB 3-0 SH 27 (SUTURE) ×2
SUT MON AB 3-0 SH27 (SUTURE) ×2 IMPLANT
SUT VIC AB 2-0 CT1 27 (SUTURE) ×2
SUT VIC AB 2-0 CT1 TAPERPNT 27 (SUTURE) ×2 IMPLANT
TOWEL GREEN STERILE (TOWEL DISPOSABLE) ×2 IMPLANT
TOWEL GREEN STERILE FF (TOWEL DISPOSABLE) ×2 IMPLANT
TUBE CONNECTING 12X1/4 (SUCTIONS) ×2 IMPLANT
WASHER FLAT ACE (Orthopedic Implant) ×1 IMPLANT
WASHER PLAIN FLAT ACE NS 3PK (Orthopedic Implant) ×1 IMPLANT

## 2020-09-16 NOTE — Anesthesia Procedure Notes (Signed)
Anesthesia Regional Block: Adductor canal block   Pre-Anesthetic Checklist: ,, timeout performed, Correct Patient, Correct Site, Correct Laterality, Correct Procedure, Correct Position, site marked, Risks and benefits discussed,  Surgical consent,  Pre-op evaluation,  At surgeon's request and post-op pain management  Laterality: Right  Prep: chloraprep       Needles:  Injection technique: Single-shot  Needle Type: Echogenic Stimulator Needle     Needle Length: 5cm  Needle Gauge: 22     Additional Needles:   Procedures:, nerve stimulator,,, ultrasound used (permanent image in chart),,,,   Nerve Stimulator or Paresthesia:  Response: 0.45 mA,   Additional Responses:   Narrative:  Start time: 09/16/2020 1:35 PM End time: 09/16/2020 1:40 PM Injection made incrementally with aspirations every 5 mL.  Performed by: Personally  Anesthesiologist: Merlinda Frederick, MD  Additional Notes: Functioning IV was confirmed and monitors were applied. Sterile prep and drape,hand hygiene and sterile gloves were used. Ultrasound guidance: relevant anatomy identified, needle position confirmed, local anesthetic spread visualized around nerve(s)., vascular puncture avoided.  Image printed for medical record. Negative aspiration and negative test dose prior to incremental administration of local anesthetic. The patient tolerated the procedure well.

## 2020-09-16 NOTE — Op Note (Signed)
Keith Ward male 65 y.o. 09/16/2020  PreOperative Diagnosis: Right pilon ankle fracture with fibula fracture Syndesmosis disruption  PostOperative Diagnosis: Same  PROCEDURE: Open treatment of right intra-articular distal tibia fracture with fibula fracture fixation Open treatment of syndesmosis Ankle stress view fluoroscopy  SURGEON: Melony Overly, MD  ASSISTANT:  Morley Kos, OPA-C  (Present and scrubbed throughout the case, critical for assistance with exposure, retraction, reduction of fracture, placement of hardware, and closure.)     ANESTHESIA: MAC anesthesia with peripheral nerve blockade  FINDINGS: Displaced intra-articular distal tibia fracture with comminuted fibula fracture.  Syndesmotic disruption.   IMPLANTS: [employer] medial distal tibial locking plate One third tubular plate   [id number]:65 y.o. male who is an inmate and currently living at [facility] twisted his ankle while using the restroom.  He had pain and swelling and x-rays at the facility demonstrated an ankle fracture.  He was sent to my office where there was concern for intra-articular distal tibia fracture and CT scan was ordered.  CT scan demonstrated disrupted and displaced intra-articular distal tibia fracture with fibula fracture.  There is also evidence of syndesmotic disruption.  He was indicated for surgery due to this.   Patient understood the risks, benefits and alternatives to surgery which include but are not limited to wound healing complications, infection, nonunion, malunion, need for further surgery as well as damage to surrounding structures. They also understood the potential for continued pain in that there were no guarantees of acceptable outcome After weighing these risks the patient opted to proceed with surgery.  PROCEDURE: Patient was identified in the preoperative holding area.  The right leg was marked by myself.  Consent was signed by myself and the patient.   Block was performed by anesthesia in the preoperative holding area.  Patient was taken to the operative suite and placed supine on the operative table.  MAC anesthesia was induced without difficulty. Bump was placed under the operative hip and bone foam was used.  All bony prominences were well padded.  Tourniquet was placed on the operative thigh.  Preoperative antibiotics were given. The extremity was prepped and draped in the usual sterile fashion and surgical timeout was performed.  The limb was elevated and the tourniquet was inflated to 250 mmHg.  I began by making a longitudinal incision overlying the distal fibula.  This was from proximal to the fracture site distal to the tip of the fibula.  It was taken sharply down through skin and subcutaneous tissue.  Blunt dissection was used to mobilize soft tissues.  The superficial peroneal nerve was identified and found to be very wispy and thin.  It was protected through the entirety of the case.  In the fracture site of the fibula was identified.  There was comminution there.  The fracture was not completely cleared of hematoma as it was a long oblique fracture with comminution.  Distally the incision was carried anteriorly to gain access to the anterolateral aspect of the distal tibial plafond.  The dissection was carried below the peroneus tertius muscle belly and tendon to the anterior aspect of the tibia.  There is a large fragment of the anterior portion of the incised sure of the tibia that was flipped and rotated.  This fracture fragment was fully mobilized and using a pointed reduction clamp on the distal fibula the fibula was pulled out to length and this piece was able to be reduced onto the tibia and held provisionally with K wire fixation.  There  is near anatomic reduction of this piece.  Then we carried our dissection posterior to the fibula to gain access to the posterior aspect of the tibia.  There was displaced posterior intra-articular nature  of the fracture in this area and therefore blunt dissection was used to gain access to this fracture fragment.  The interval was between the peroneal tendons and the fibula posteriorly.  [name] was used to mobilize the fracture fragment and a rondure was used to clear out fracture hematoma.  Then a Weber clamp was used from anterior to posterior to clamp across this fracture.  Fluoroscopy confirmed appropriate reduction of the joint on the lateral plane.  Then a one third tubular plate was placed on the fibula on the 3 distal holes were filled.  Then through manual traction a second time through the distal fibula using a pointed reduction clamp the fibula was pulled out to length and help provisionally with K wire fixation to the tibia.  Then the proximal holes were filled within the one third tubular plate.  No locking screws were used within the fibula.  We then turned our attention back to the tibia.  The anterior portion of the syndesmosis in size sure was inspected and found to be acceptably reduced.  A single 3.5 mm cortical screw was used with a washer to fix this fragment of the tibia fracture.    We then turned our attention to the medial side of the leg.  A medial incision was made overlying the medial malleolus.  It was taken sharply down through skin and subcutaneous tissue.  The soft tissue was then elevated off the periosteum proximally.  An ALPS low bend medial locking plate was placed in a percutaneous fashion about the medial distal tibia.  K wire was used to hold the plate in place.  Then fluoroscopy confirmed appropriate position of the plate.  Then a single cortical screw was placed at the bend of the plate within the distal tibial shaft.  The subcu plate down to bone.  Then distal locking screws were placed sequentially from distal to proximal.  Fluoroscopy confirmed appropriate position of the screws.  Then the proximal cortical screws were placed within the plate.  Fluoroscopy  confirmed appropriate position and reduction of the fractures.  Then using fluoroscopy the syndesmosis was stressed.  There is instability left of the syndesmosis despite fixation of the tibia and fibula.  The syndesmosis was then reduced under direct visualization.  A Weber clamp was used across the joint to hold it provisionally.  Then 2 syndesmosis screws were placed.  The angle of the screws was slightly plantar due to several screws in the area of the distal tibia.  After placement of the 2 screws the syndesmosis was stressed and found to be stable.  The final fluoroscopic images were obtained.  Then the wound was irrigated with normal saline.  Vancomycin powder was placed medially and laterally within the wounds.  The wounds were then closed in a layered fashion using 3-0 Monocryl and staples.  The tourniquet was deflated.  The tourniquet time was 90 minutes.  Soft dressing including Xeroform, 4 x 4's and sterile sheet cotton was placed.  He was placed in a nonweightbearing short leg splint.  He was then awakened from anesthesia and taken recovery in stable condition.  There are no complications.  He tolerated the procedure well.  POST OPERATIVE INSTRUCTIONS: Nonweightbearing to operative extremity Keep splint in place Take 1 325 mg aspirin daily for DVT  prophylaxis Patient will follow up in 2 weeks for splint removal, nonweightbearing x-rays and placement of a short leg nonweightbearing cast  TOURNIQUET TIME: 90 minutes  BLOOD LOSS:  Minimal         DRAINS: none         SPECIMEN: none       COMPLICATIONS:  * No complications entered in OR log *         Disposition: PACU - hemodynamically stable.         Condition: stable

## 2020-09-16 NOTE — Progress Notes (Signed)
Called report to [facility], gave discharge instructions to [place], Therapist, sports.  Rowe Pavy, RN

## 2020-09-16 NOTE — Discharge Instructions (Signed)
DR. [facility] FOOT [facility] SURGERY POST-OP INSTRUCTIONS   Pain Management 1. The numbing medicine and your leg will last around 18 hours, take a dose of your pain medicine as soon as you feel it wearing off to avoid rebound pain. 2. Keep your foot elevated above heart level.  Make sure that your heel hangs free ('floats'). 3. Take all prescribed medication as directed. 4. If taking narcotic pain medication you may want to use an over-the-counter stool softener to avoid constipation. 5. You may take over-the-counter NSAIDs (ibuprofen, naproxen, etc.) as well as over-the-counter acetaminophen as directed on the packaging as a supplement for your pain and may also use it to wean away from the prescription medication.  Activity ? Non-weightbearing ? Keep splint intact  First Postoperative Visit 1. Your first postop visit will be at least 2 weeks after surgery.  This should be scheduled when you schedule surgery. 2. If you do not have a postoperative visit scheduled please call [phone] to schedule an appointment. 3. At the appointment your incision will be evaluated for suture removal, x-rays will be obtained if necessary.  General Instructions 1. Swelling is very common after foot and ankle surgery.  It often takes 3 months for the foot and ankle to begin to feel comfortable.  Some amount of swelling will persist for 6-12 months. 2. DO NOT change the dressing.  If there is a problem with the dressing (too tight, loose, gets wet, etc.) please contact Dr. Suhaas Agena's office. 3. DO NOT get the dressing wet.  For showers you can use an over-the-counter cast cover or wrap a washcloth around the top of your dressing and then cover it with a plastic bag and tape it to your leg. 4. DO NOT soak the incision (no tubs, pools, bath, etc.) until you have approval from Dr. Jinnifer Montejano.  Contact Dr. Adairs office or go to Emergency Room if: 1. Temperature above 101 F. 2. Increasing pain that is unresponsive to pain  medication or elevation 3. Excessive redness or swelling in your foot 4. Dressing problems - excessive bloody drainage, looseness or tightness, or if dressing gets wet 5. Develop pain, swelling, warmth, or discoloration of your calf  

## 2020-09-16 NOTE — Transfer of Care (Signed)
Immediate Anesthesia Transfer of Care Note  Patient: Keith Ward  Procedure(s) Performed: OPEN TREATMENT OF PILON ANKLE FRACTURE WITH FIBULA, SYNDESMOSIS, POSSIBLE PLACEMENT OF EXTERNAL FIXATOR (Right Ankle)  Patient Location: PACU  Anesthesia Type:MAC combined with regional for post-op pain  Level of Consciousness: drowsy and patient cooperative  Airway & Oxygen Therapy: Patient Spontanous Breathing and Patient connected to face mask oxygen  Post-op Assessment: Report given to RN, Post -op Vital signs reviewed and stable and Patient moving all extremities  Post vital signs: Reviewed and stable  Last Vitals:  Vitals Value Taken Time  BP 136/88 09/16/20 1616  Temp    Pulse 100 09/16/20 1618  Resp 12 09/16/20 1618  SpO2 100 % 09/16/20 1618  Vitals shown include unvalidated device data.  Last Pain:  Vitals:   09/16/20 1141  TempSrc:   PainSc: 10-Worst pain ever      Patients Stated Pain Goal: 3 (37/94/32 7614)  Complications: No complications documented.

## 2020-09-16 NOTE — Anesthesia Procedure Notes (Signed)
Procedure Name: MAC Date/Time: 09/16/2020 1:57 PM Performed by: Claris Che, CRNA Pre-anesthesia Checklist: Patient identified, Emergency Drugs available, Suction available, Patient being monitored and Timeout performed Patient Re-evaluated:Patient Re-evaluated prior to induction Oxygen Delivery Method: Simple face mask Ventilation: Oral airway inserted - appropriate to patient size Dental Injury: Teeth and Oropharynx as per pre-operative assessment

## 2020-09-16 NOTE — Anesthesia Procedure Notes (Signed)
Anesthesia Regional Block: Popliteal block   Pre-Anesthetic Checklist: ,, timeout performed, Correct Patient, Correct Site, Correct Laterality, Correct Procedure, Correct Position, site marked, Risks and benefits discussed,  Surgical consent,  Pre-op evaluation,  At surgeon's request and post-op pain management  Laterality: Right  Prep: chloraprep       Needles:  Injection technique: Single-shot  Needle Type: Echogenic Stimulator Needle          Additional Needles:   Procedures:,,,, ultrasound used (permanent image in chart),,,,  Narrative:  Start time: 09/16/2020 1:35 PM End time: 09/16/2020 1:40 PM Injection made incrementally with aspirations every 5 mL.  Performed by: Personally  Anesthesiologist: Merlinda Frederick, MD  Additional Notes: A functioning IV was confirmed and monitors were applied.  Sterile prep and drape, hand hygiene and sterile gloves were used.  Negative aspiration and test dose prior to incremental administration of local anesthetic. The patient tolerated the procedure well.Ultrasound  guidance: relevant anatomy identified, needle position confirmed, local anesthetic spread visualized around nerve(s), vascular puncture avoided.  Image printed for medical record.

## 2020-09-16 NOTE — H&P (Signed)
PREOPERATIVE H&P  Chief Complaint: Right ankle pain  HPI: Keith Ward is a 65 y.o. male who presents for preoperative history and physical with a diagnosis of right pilon ankle fracture with syndesmotic disruption and associated fibula fracture.  He had a fall.  Patient is an inmate and is living at [facility] due to underlying medical conditions.  He takes oxygen chronically at baseline and has diabetes.  He had pain and swelling in his ankle.  He was seen by [facility] physician and sent to my office for evaluation.  Given the amount of displacement and intra-articular nature of his fracture he was indicated for surgery.  He also had evidence of possible nondisplaced crack at the base of the second metatarsal but not much pain to palpation in this area.  He is here today for surgery.. Symptoms are rated as moderate to severe, and have been worsening.  This is significantly impairing activities of daily living.  He has elected for surgical management.   Past Medical History:  Diagnosis Date  . Anemia   . Asthma   . Cardiomyopathy ([place])   . Chronic kidney disease   . COPD (chronic obstructive pulmonary disease) ([place])   . Coronary artery disease   . Depression   . Diabetes mellitus without complication ([place])   . Enlarged prostate   . GERD (gastroesophageal reflux disease)   . History of backache   . History of kidney stones   . History of ureteral obstruction    bilateral  . History of UTI   . Hypercholesterolemia   . Hyperlipidemia   . Hypertension   . Malignant neoplasm of urinary bladder ([place])   . Peripheral neuropathy   . Restless legs syndrome (RLS)   . Tobacco use disorder    Past Surgical History:  Procedure Laterality Date  . CORONARY ANGIOPLASTY WITH STENT PLACEMENT     x3 bare metal  . CYSTOSCOPY W/ RETROGRADES     multiple  . CYSTOSCOPY W/ STONE MANIPULATION     multiple  . CYSTOSCOPY W/ URETERAL STENT PLACEMENT     Multiple  . TRANSURETHRAL RESECTION OF BLADDER  TUMOR     multiple  . Ureteral Stent Exchange     multiple   Social History   Socioeconomic History  . Marital status: Single    Spouse name: Not on file  . Number of children: Not on file  . Years of education: Not on file  . Highest education level: Not on file  Occupational History  . Not on file  Tobacco Use  . Smoking status: Unknown If Ever Smoked  . Smokeless tobacco: Not on file  . Tobacco comment: 09/15/20  has not smoked in over a year.  Substance and Sexual Activity  . Alcohol use: Not Currently  . Drug use: Not Currently  . Sexual activity: Not on file  Other Topics Concern  . Not on file  Social History Narrative  . Not on file   Social Determinants of Health   Financial Resource Strain: Not on file  Food Insecurity: Not on file  Transportation Needs: Not on file  Physical Activity: Not on file  Stress: Not on file  Social Connections: Not on file   Family History  Adopted: Yes   Not on File Prior to Admission medications   Medication Sig Start Date End Date Taking? Authorizing Provider  atorvastatin (LIPITOR) 10 MG tablet Take 10 mg by mouth at bedtime.   Yes [provider]  baclofen (LIORESAL)  10 MG tablet Take 5 mg by mouth 3 (three) times daily.   Yes [provider]  cetirizine (ZYRTEC) 10 MG tablet Take 10 mg by mouth at bedtime.   Yes [provider]  cholecalciferol (VITAMIN D) 25 MCG (1000 UNIT) tablet Take 1,000 Units by mouth daily.   Yes [provider]  gabapentin (NEURONTIN) 300 MG capsule Take 300 mg by mouth 3 (three) times daily.   Yes [provider]  gemfibrozil (LOPID) 600 MG tablet Take 600 mg by mouth in the morning and at bedtime.   Yes [provider]  Guaifenesin 1200 MG TB12 Take 2,400 mg by mouth daily.   Yes [provider]  insulin glargine (LANTUS) 100 UNIT/ML injection Inject 22 Units into the skin at bedtime.   Yes [provider]  insulin lispro  (HUMALOG) 100 UNIT/ML injection Inject 0-5 Units into the skin 3 (three) times daily before meals. (HOLD FOR BLOOD SUGAR LESS THAN 150) 151-200=1 UNIT, 201-250=2 UNITS, 251-300=3 UNITS, 301-350=4 UNITS, 351-400=5 UNITS, IF BS GREATER THAN 400 CALL MD   Yes [provider]  tamsulosin (FLOMAX) 0.4 MG CAPS capsule Take 0.4 mg by mouth at bedtime.   Yes [provider]  Tetrahydrozoline-PEG (ADVANCED LUBRICANT OP) Apply 1 application to eye at bedtime. Apply a thin ribbon of ointment to lower eyelid at bedtime   Yes [provider]     Positive ROS: All other systems have been reviewed and were otherwise negative with the exception of those mentioned in the HPI and as above.  Physical Exam:  Vitals:   09/16/20 1124  BP: 113/72  Pulse: (!) 116  Resp: 20  Temp: 97.7 F (36.5 C)  SpO2: 99%   General: Alert, no acute distress Cardiovascular: No pedal edema Respiratory: No cyanosis, no use of accessory musculature.  On nasal cannula. GI: No organomegaly, abdomen is soft and non-tender Skin: No lesions in the area of chief complaint Neurologic: Sensation intact distally Psychiatric: Patient is competent for consent with normal mood and affect Lymphatic: No axillary or cervical lymphadenopathy  MUSCULOSKELETAL: Right ankle with swelling.  Is grossly unstable.  No open wounds.  Swelling about the ankle.  No tenderness over the Lisfranc joint or midfoot.  Tenderness over the medial malleolus and fibula.  No tenderness proximal about the leg.  No pain to palpation about the knee.  He endorses sensation to light touch on the dorsal and plantar foot.  Palpable dorsalis pedis pulse.  Assessment: Right pilon ankle fracture with syndesmotic disruption and fibula fracture   Plan: Plan for open treatment of his pilon ankle fracture with fibular fixation.  We will also need treatment of his syndesmotic disruption.  He does have evidence of possible chronic injury to the second  metatarsal base.  Difficult to determine whether this is acute or not.  He does not have tenderness in this area and therefore we will plan to treat this conservatively.  We discussed the risks, benefits and alternatives of surgery which include but are not limited to wound healing complications, infection, nonunion, malunion, need for further surgery, damage to surrounding structures and continued pain.  They understand there is no guarantees to an acceptable outcome.  After weighing these risks they opted to proceed with surgery.     Erle Crocker, MD    09/16/2020 1:37 PM

## 2020-09-17 NOTE — Anesthesia Postprocedure Evaluation (Signed)
Anesthesia Post Note  Patient: Keith Ward  Procedure(s) Performed: OPEN TREATMENT OF PILON ANKLE FRACTURE WITH FIBULA, SYNDESMOSIS, POSSIBLE PLACEMENT OF EXTERNAL FIXATOR (Right Ankle)     Patient location during evaluation: PACU Anesthesia Type: Regional and MAC Level of consciousness: awake and alert Pain management: pain level controlled Vital Signs Assessment: post-procedure vital signs reviewed and stable Respiratory status: spontaneous breathing, nonlabored ventilation, respiratory function stable and patient connected to nasal cannula oxygen Cardiovascular status: stable and blood pressure returned to baseline Postop Assessment: no apparent nausea or vomiting Anesthetic complications: no   No complications documented.  Last Vitals:  Vitals:   09/16/20 1646 09/16/20 1701  BP: 124/84 (!) 140/94  Pulse: 84 89  Resp: 11 13  Temp:  (!) 36.2 C  SpO2: 99% 100%    Last Pain:  Vitals:   09/16/20 1646  TempSrc:   PainSc: 0-No pain                 Jerrika Ledlow S

## 2020-09-22 DIAGNOSIS — J9621 Acute and chronic respiratory failure with hypoxia: Secondary | ICD-10-CM

## 2020-09-22 DIAGNOSIS — I2510 Atherosclerotic heart disease of native coronary artery without angina pectoris: Secondary | ICD-10-CM

## 2020-09-22 DIAGNOSIS — J449 Chronic obstructive pulmonary disease, unspecified: Secondary | ICD-10-CM

## 2020-09-22 DIAGNOSIS — U071 COVID-19: Secondary | ICD-10-CM

## 2020-09-23 ENCOUNTER — Encounter (HOSPITAL_COMMUNITY): Payer: Self-pay | Admitting: Orthopaedic Surgery

## 2020-09-23 DIAGNOSIS — U071 COVID-19: Secondary | ICD-10-CM

## 2020-09-23 DIAGNOSIS — I2510 Atherosclerotic heart disease of native coronary artery without angina pectoris: Secondary | ICD-10-CM

## 2020-09-23 DIAGNOSIS — J9621 Acute and chronic respiratory failure with hypoxia: Secondary | ICD-10-CM

## 2020-09-23 DIAGNOSIS — J449 Chronic obstructive pulmonary disease, unspecified: Secondary | ICD-10-CM

## 2020-09-24 DIAGNOSIS — J9621 Acute and chronic respiratory failure with hypoxia: Secondary | ICD-10-CM

## 2020-09-24 DIAGNOSIS — J449 Chronic obstructive pulmonary disease, unspecified: Secondary | ICD-10-CM

## 2020-09-24 DIAGNOSIS — U071 COVID-19: Secondary | ICD-10-CM

## 2020-09-24 DIAGNOSIS — I2510 Atherosclerotic heart disease of native coronary artery without angina pectoris: Secondary | ICD-10-CM

## 2020-09-25 DIAGNOSIS — I2510 Atherosclerotic heart disease of native coronary artery without angina pectoris: Secondary | ICD-10-CM

## 2020-09-25 DIAGNOSIS — J449 Chronic obstructive pulmonary disease, unspecified: Secondary | ICD-10-CM

## 2020-09-25 DIAGNOSIS — J9621 Acute and chronic respiratory failure with hypoxia: Secondary | ICD-10-CM

## 2020-09-25 DIAGNOSIS — U071 COVID-19: Secondary | ICD-10-CM

## 2020-09-26 DIAGNOSIS — I2510 Atherosclerotic heart disease of native coronary artery without angina pectoris: Secondary | ICD-10-CM

## 2020-09-26 DIAGNOSIS — U071 COVID-19: Secondary | ICD-10-CM

## 2020-09-26 DIAGNOSIS — J9621 Acute and chronic respiratory failure with hypoxia: Secondary | ICD-10-CM

## 2020-09-26 DIAGNOSIS — J449 Chronic obstructive pulmonary disease, unspecified: Secondary | ICD-10-CM

## 2020-09-27 DIAGNOSIS — J449 Chronic obstructive pulmonary disease, unspecified: Secondary | ICD-10-CM

## 2020-09-27 DIAGNOSIS — I2510 Atherosclerotic heart disease of native coronary artery without angina pectoris: Secondary | ICD-10-CM

## 2020-09-27 DIAGNOSIS — J9621 Acute and chronic respiratory failure with hypoxia: Secondary | ICD-10-CM

## 2020-09-27 DIAGNOSIS — U071 COVID-19: Secondary | ICD-10-CM

## 2020-09-28 DIAGNOSIS — J449 Chronic obstructive pulmonary disease, unspecified: Secondary | ICD-10-CM

## 2020-09-28 DIAGNOSIS — U071 COVID-19: Secondary | ICD-10-CM

## 2020-09-28 DIAGNOSIS — I2510 Atherosclerotic heart disease of native coronary artery without angina pectoris: Secondary | ICD-10-CM

## 2020-09-28 DIAGNOSIS — J9621 Acute and chronic respiratory failure with hypoxia: Secondary | ICD-10-CM | POA: Diagnosis not present

## 2020-10-06 DIAGNOSIS — J449 Chronic obstructive pulmonary disease, unspecified: Secondary | ICD-10-CM

## 2020-10-06 DIAGNOSIS — U071 COVID-19: Secondary | ICD-10-CM

## 2020-10-06 DIAGNOSIS — J9621 Acute and chronic respiratory failure with hypoxia: Secondary | ICD-10-CM

## 2020-10-06 DIAGNOSIS — I2510 Atherosclerotic heart disease of native coronary artery without angina pectoris: Secondary | ICD-10-CM

## 2020-10-20 DIAGNOSIS — J9621 Acute and chronic respiratory failure with hypoxia: Secondary | ICD-10-CM

## 2020-10-20 DIAGNOSIS — I2510 Atherosclerotic heart disease of native coronary artery without angina pectoris: Secondary | ICD-10-CM

## 2020-10-20 DIAGNOSIS — U071 COVID-19: Secondary | ICD-10-CM

## 2020-10-20 DIAGNOSIS — J449 Chronic obstructive pulmonary disease, unspecified: Secondary | ICD-10-CM

## 2020-10-21 DIAGNOSIS — I2510 Atherosclerotic heart disease of native coronary artery without angina pectoris: Secondary | ICD-10-CM

## 2020-10-21 DIAGNOSIS — J449 Chronic obstructive pulmonary disease, unspecified: Secondary | ICD-10-CM

## 2020-10-21 DIAGNOSIS — U071 COVID-19: Secondary | ICD-10-CM

## 2020-10-21 DIAGNOSIS — J9621 Acute and chronic respiratory failure with hypoxia: Secondary | ICD-10-CM

## 2020-10-22 DIAGNOSIS — U071 COVID-19: Secondary | ICD-10-CM

## 2020-10-22 DIAGNOSIS — J449 Chronic obstructive pulmonary disease, unspecified: Secondary | ICD-10-CM

## 2020-10-22 DIAGNOSIS — I2510 Atherosclerotic heart disease of native coronary artery without angina pectoris: Secondary | ICD-10-CM

## 2020-10-22 DIAGNOSIS — J9621 Acute and chronic respiratory failure with hypoxia: Secondary | ICD-10-CM

## 2020-10-23 DIAGNOSIS — J449 Chronic obstructive pulmonary disease, unspecified: Secondary | ICD-10-CM

## 2020-10-23 DIAGNOSIS — I2510 Atherosclerotic heart disease of native coronary artery without angina pectoris: Secondary | ICD-10-CM

## 2020-10-23 DIAGNOSIS — U071 COVID-19: Secondary | ICD-10-CM

## 2020-10-23 DIAGNOSIS — J9621 Acute and chronic respiratory failure with hypoxia: Secondary | ICD-10-CM

## 2020-10-24 DIAGNOSIS — J449 Chronic obstructive pulmonary disease, unspecified: Secondary | ICD-10-CM

## 2020-10-24 DIAGNOSIS — I2510 Atherosclerotic heart disease of native coronary artery without angina pectoris: Secondary | ICD-10-CM

## 2020-10-24 DIAGNOSIS — J9621 Acute and chronic respiratory failure with hypoxia: Secondary | ICD-10-CM

## 2020-10-24 DIAGNOSIS — U071 COVID-19: Secondary | ICD-10-CM

## 2020-10-25 DIAGNOSIS — I2510 Atherosclerotic heart disease of native coronary artery without angina pectoris: Secondary | ICD-10-CM

## 2020-10-25 DIAGNOSIS — J449 Chronic obstructive pulmonary disease, unspecified: Secondary | ICD-10-CM

## 2020-10-25 DIAGNOSIS — U071 COVID-19: Secondary | ICD-10-CM

## 2020-10-25 DIAGNOSIS — J9621 Acute and chronic respiratory failure with hypoxia: Secondary | ICD-10-CM

## 2020-10-26 DIAGNOSIS — I2510 Atherosclerotic heart disease of native coronary artery without angina pectoris: Secondary | ICD-10-CM

## 2020-10-26 DIAGNOSIS — J449 Chronic obstructive pulmonary disease, unspecified: Secondary | ICD-10-CM

## 2020-10-26 DIAGNOSIS — U071 COVID-19: Secondary | ICD-10-CM

## 2020-10-26 DIAGNOSIS — J9621 Acute and chronic respiratory failure with hypoxia: Secondary | ICD-10-CM

## 2020-11-03 DIAGNOSIS — I2510 Atherosclerotic heart disease of native coronary artery without angina pectoris: Secondary | ICD-10-CM

## 2020-11-03 DIAGNOSIS — J9621 Acute and chronic respiratory failure with hypoxia: Secondary | ICD-10-CM

## 2020-11-03 DIAGNOSIS — U071 COVID-19: Secondary | ICD-10-CM

## 2020-11-03 DIAGNOSIS — J449 Chronic obstructive pulmonary disease, unspecified: Secondary | ICD-10-CM

## 2020-11-04 DIAGNOSIS — J9621 Acute and chronic respiratory failure with hypoxia: Secondary | ICD-10-CM

## 2020-11-04 DIAGNOSIS — I2510 Atherosclerotic heart disease of native coronary artery without angina pectoris: Secondary | ICD-10-CM

## 2020-11-04 DIAGNOSIS — J449 Chronic obstructive pulmonary disease, unspecified: Secondary | ICD-10-CM

## 2020-11-04 DIAGNOSIS — U071 COVID-19: Secondary | ICD-10-CM

## 2020-11-05 DIAGNOSIS — I2510 Atherosclerotic heart disease of native coronary artery without angina pectoris: Secondary | ICD-10-CM

## 2020-11-05 DIAGNOSIS — J9621 Acute and chronic respiratory failure with hypoxia: Secondary | ICD-10-CM

## 2020-11-05 DIAGNOSIS — U071 COVID-19: Secondary | ICD-10-CM

## 2020-11-05 DIAGNOSIS — J449 Chronic obstructive pulmonary disease, unspecified: Secondary | ICD-10-CM

## 2020-11-06 DIAGNOSIS — U071 COVID-19: Secondary | ICD-10-CM

## 2020-11-06 DIAGNOSIS — I2510 Atherosclerotic heart disease of native coronary artery without angina pectoris: Secondary | ICD-10-CM

## 2020-11-06 DIAGNOSIS — J449 Chronic obstructive pulmonary disease, unspecified: Secondary | ICD-10-CM

## 2020-11-06 DIAGNOSIS — J9621 Acute and chronic respiratory failure with hypoxia: Secondary | ICD-10-CM

## 2020-11-07 DIAGNOSIS — U071 COVID-19: Secondary | ICD-10-CM

## 2020-11-07 DIAGNOSIS — J9621 Acute and chronic respiratory failure with hypoxia: Secondary | ICD-10-CM

## 2020-11-07 DIAGNOSIS — I2510 Atherosclerotic heart disease of native coronary artery without angina pectoris: Secondary | ICD-10-CM

## 2020-11-07 DIAGNOSIS — J449 Chronic obstructive pulmonary disease, unspecified: Secondary | ICD-10-CM

## 2020-11-09 DIAGNOSIS — I2510 Atherosclerotic heart disease of native coronary artery without angina pectoris: Secondary | ICD-10-CM

## 2020-11-09 DIAGNOSIS — U071 COVID-19: Secondary | ICD-10-CM

## 2020-11-09 DIAGNOSIS — J449 Chronic obstructive pulmonary disease, unspecified: Secondary | ICD-10-CM

## 2020-11-09 DIAGNOSIS — J9621 Acute and chronic respiratory failure with hypoxia: Secondary | ICD-10-CM

## 2020-11-11 DIAGNOSIS — J449 Chronic obstructive pulmonary disease, unspecified: Secondary | ICD-10-CM

## 2020-11-11 DIAGNOSIS — J9621 Acute and chronic respiratory failure with hypoxia: Secondary | ICD-10-CM

## 2020-11-11 DIAGNOSIS — I2510 Atherosclerotic heart disease of native coronary artery without angina pectoris: Secondary | ICD-10-CM

## 2020-11-11 DIAGNOSIS — U071 COVID-19: Secondary | ICD-10-CM

## 2020-11-12 DIAGNOSIS — U071 COVID-19: Secondary | ICD-10-CM

## 2020-11-12 DIAGNOSIS — J9621 Acute and chronic respiratory failure with hypoxia: Secondary | ICD-10-CM

## 2020-11-12 DIAGNOSIS — J449 Chronic obstructive pulmonary disease, unspecified: Secondary | ICD-10-CM

## 2020-11-12 DIAGNOSIS — I2510 Atherosclerotic heart disease of native coronary artery without angina pectoris: Secondary | ICD-10-CM

## 2020-11-13 DIAGNOSIS — J449 Chronic obstructive pulmonary disease, unspecified: Secondary | ICD-10-CM

## 2020-11-13 DIAGNOSIS — J9621 Acute and chronic respiratory failure with hypoxia: Secondary | ICD-10-CM

## 2020-11-13 DIAGNOSIS — U071 COVID-19: Secondary | ICD-10-CM

## 2020-11-13 DIAGNOSIS — I2510 Atherosclerotic heart disease of native coronary artery without angina pectoris: Secondary | ICD-10-CM

## 2020-11-14 DIAGNOSIS — J9621 Acute and chronic respiratory failure with hypoxia: Secondary | ICD-10-CM

## 2020-11-14 DIAGNOSIS — U071 COVID-19: Secondary | ICD-10-CM

## 2020-11-14 DIAGNOSIS — J449 Chronic obstructive pulmonary disease, unspecified: Secondary | ICD-10-CM

## 2020-11-14 DIAGNOSIS — I2510 Atherosclerotic heart disease of native coronary artery without angina pectoris: Secondary | ICD-10-CM

## 2020-11-15 DIAGNOSIS — U071 COVID-19: Secondary | ICD-10-CM

## 2020-11-15 DIAGNOSIS — J449 Chronic obstructive pulmonary disease, unspecified: Secondary | ICD-10-CM

## 2020-11-15 DIAGNOSIS — J9621 Acute and chronic respiratory failure with hypoxia: Secondary | ICD-10-CM

## 2020-11-15 DIAGNOSIS — I2510 Atherosclerotic heart disease of native coronary artery without angina pectoris: Secondary | ICD-10-CM

## 2020-11-16 DIAGNOSIS — U071 COVID-19: Secondary | ICD-10-CM

## 2020-11-16 DIAGNOSIS — J449 Chronic obstructive pulmonary disease, unspecified: Secondary | ICD-10-CM

## 2020-11-16 DIAGNOSIS — J9621 Acute and chronic respiratory failure with hypoxia: Secondary | ICD-10-CM

## 2020-11-16 DIAGNOSIS — I2510 Atherosclerotic heart disease of native coronary artery without angina pectoris: Secondary | ICD-10-CM

## 2020-11-17 DIAGNOSIS — I2510 Atherosclerotic heart disease of native coronary artery without angina pectoris: Secondary | ICD-10-CM

## 2020-11-17 DIAGNOSIS — J9621 Acute and chronic respiratory failure with hypoxia: Secondary | ICD-10-CM

## 2020-11-17 DIAGNOSIS — J449 Chronic obstructive pulmonary disease, unspecified: Secondary | ICD-10-CM

## 2020-11-17 DIAGNOSIS — U071 COVID-19: Secondary | ICD-10-CM

## 2020-11-18 DIAGNOSIS — I2510 Atherosclerotic heart disease of native coronary artery without angina pectoris: Secondary | ICD-10-CM

## 2020-11-18 DIAGNOSIS — J449 Chronic obstructive pulmonary disease, unspecified: Secondary | ICD-10-CM

## 2020-11-18 DIAGNOSIS — U071 COVID-19: Secondary | ICD-10-CM

## 2020-11-18 DIAGNOSIS — J9621 Acute and chronic respiratory failure with hypoxia: Secondary | ICD-10-CM

## 2020-11-19 DIAGNOSIS — J9621 Acute and chronic respiratory failure with hypoxia: Secondary | ICD-10-CM

## 2020-11-19 DIAGNOSIS — I2510 Atherosclerotic heart disease of native coronary artery without angina pectoris: Secondary | ICD-10-CM

## 2020-11-19 DIAGNOSIS — U071 COVID-19: Secondary | ICD-10-CM

## 2020-11-19 DIAGNOSIS — J449 Chronic obstructive pulmonary disease, unspecified: Secondary | ICD-10-CM

## 2020-11-20 DIAGNOSIS — J449 Chronic obstructive pulmonary disease, unspecified: Secondary | ICD-10-CM

## 2020-11-20 DIAGNOSIS — I2510 Atherosclerotic heart disease of native coronary artery without angina pectoris: Secondary | ICD-10-CM

## 2020-11-20 DIAGNOSIS — U071 COVID-19: Secondary | ICD-10-CM

## 2020-11-20 DIAGNOSIS — J9621 Acute and chronic respiratory failure with hypoxia: Secondary | ICD-10-CM

## 2020-11-21 DIAGNOSIS — I2510 Atherosclerotic heart disease of native coronary artery without angina pectoris: Secondary | ICD-10-CM

## 2020-11-21 DIAGNOSIS — J9621 Acute and chronic respiratory failure with hypoxia: Secondary | ICD-10-CM

## 2020-11-21 DIAGNOSIS — J449 Chronic obstructive pulmonary disease, unspecified: Secondary | ICD-10-CM

## 2020-11-21 DIAGNOSIS — U071 COVID-19: Secondary | ICD-10-CM

## 2020-11-22 DIAGNOSIS — J9621 Acute and chronic respiratory failure with hypoxia: Secondary | ICD-10-CM

## 2020-11-22 DIAGNOSIS — J449 Chronic obstructive pulmonary disease, unspecified: Secondary | ICD-10-CM

## 2020-11-22 DIAGNOSIS — U071 COVID-19: Secondary | ICD-10-CM

## 2020-11-22 DIAGNOSIS — I2510 Atherosclerotic heart disease of native coronary artery without angina pectoris: Secondary | ICD-10-CM

## 2020-12-01 DIAGNOSIS — J449 Chronic obstructive pulmonary disease, unspecified: Secondary | ICD-10-CM

## 2020-12-01 DIAGNOSIS — I2510 Atherosclerotic heart disease of native coronary artery without angina pectoris: Secondary | ICD-10-CM

## 2020-12-01 DIAGNOSIS — J9621 Acute and chronic respiratory failure with hypoxia: Secondary | ICD-10-CM

## 2020-12-01 DIAGNOSIS — U071 COVID-19: Secondary | ICD-10-CM

## 2020-12-02 DIAGNOSIS — J449 Chronic obstructive pulmonary disease, unspecified: Secondary | ICD-10-CM

## 2020-12-02 DIAGNOSIS — J9621 Acute and chronic respiratory failure with hypoxia: Secondary | ICD-10-CM

## 2020-12-02 DIAGNOSIS — U071 COVID-19: Secondary | ICD-10-CM

## 2020-12-02 DIAGNOSIS — I2510 Atherosclerotic heart disease of native coronary artery without angina pectoris: Secondary | ICD-10-CM

## 2020-12-03 DIAGNOSIS — U071 COVID-19: Secondary | ICD-10-CM

## 2020-12-03 DIAGNOSIS — J449 Chronic obstructive pulmonary disease, unspecified: Secondary | ICD-10-CM

## 2020-12-03 DIAGNOSIS — J9621 Acute and chronic respiratory failure with hypoxia: Secondary | ICD-10-CM

## 2020-12-03 DIAGNOSIS — I2510 Atherosclerotic heart disease of native coronary artery without angina pectoris: Secondary | ICD-10-CM

## 2020-12-04 DIAGNOSIS — I2510 Atherosclerotic heart disease of native coronary artery without angina pectoris: Secondary | ICD-10-CM

## 2020-12-04 DIAGNOSIS — J9621 Acute and chronic respiratory failure with hypoxia: Secondary | ICD-10-CM

## 2020-12-04 DIAGNOSIS — J449 Chronic obstructive pulmonary disease, unspecified: Secondary | ICD-10-CM

## 2020-12-04 DIAGNOSIS — U071 COVID-19: Secondary | ICD-10-CM

## 2020-12-05 DIAGNOSIS — J449 Chronic obstructive pulmonary disease, unspecified: Secondary | ICD-10-CM

## 2020-12-05 DIAGNOSIS — U071 COVID-19: Secondary | ICD-10-CM

## 2020-12-05 DIAGNOSIS — J9621 Acute and chronic respiratory failure with hypoxia: Secondary | ICD-10-CM

## 2020-12-05 DIAGNOSIS — I2510 Atherosclerotic heart disease of native coronary artery without angina pectoris: Secondary | ICD-10-CM

## 2020-12-06 DIAGNOSIS — J9621 Acute and chronic respiratory failure with hypoxia: Secondary | ICD-10-CM

## 2020-12-06 DIAGNOSIS — I2510 Atherosclerotic heart disease of native coronary artery without angina pectoris: Secondary | ICD-10-CM

## 2020-12-06 DIAGNOSIS — U071 COVID-19: Secondary | ICD-10-CM

## 2020-12-06 DIAGNOSIS — J449 Chronic obstructive pulmonary disease, unspecified: Secondary | ICD-10-CM | POA: Diagnosis not present

## 2020-12-07 DIAGNOSIS — J9621 Acute and chronic respiratory failure with hypoxia: Secondary | ICD-10-CM

## 2020-12-07 DIAGNOSIS — J449 Chronic obstructive pulmonary disease, unspecified: Secondary | ICD-10-CM

## 2020-12-07 DIAGNOSIS — I2510 Atherosclerotic heart disease of native coronary artery without angina pectoris: Secondary | ICD-10-CM

## 2020-12-07 DIAGNOSIS — U071 COVID-19: Secondary | ICD-10-CM

## 2020-12-13 DIAGNOSIS — J9621 Acute and chronic respiratory failure with hypoxia: Secondary | ICD-10-CM

## 2020-12-13 DIAGNOSIS — U071 COVID-19: Secondary | ICD-10-CM

## 2020-12-13 DIAGNOSIS — J449 Chronic obstructive pulmonary disease, unspecified: Secondary | ICD-10-CM

## 2020-12-13 DIAGNOSIS — I2510 Atherosclerotic heart disease of native coronary artery without angina pectoris: Secondary | ICD-10-CM

## 2020-12-14 DIAGNOSIS — I2510 Atherosclerotic heart disease of native coronary artery without angina pectoris: Secondary | ICD-10-CM

## 2020-12-14 DIAGNOSIS — J449 Chronic obstructive pulmonary disease, unspecified: Secondary | ICD-10-CM

## 2020-12-14 DIAGNOSIS — J9621 Acute and chronic respiratory failure with hypoxia: Secondary | ICD-10-CM

## 2020-12-14 DIAGNOSIS — U071 COVID-19: Secondary | ICD-10-CM

## 2020-12-15 DIAGNOSIS — J449 Chronic obstructive pulmonary disease, unspecified: Secondary | ICD-10-CM

## 2020-12-15 DIAGNOSIS — J9621 Acute and chronic respiratory failure with hypoxia: Secondary | ICD-10-CM

## 2020-12-15 DIAGNOSIS — U071 COVID-19: Secondary | ICD-10-CM

## 2020-12-15 DIAGNOSIS — I2510 Atherosclerotic heart disease of native coronary artery without angina pectoris: Secondary | ICD-10-CM

## 2020-12-16 DIAGNOSIS — I2510 Atherosclerotic heart disease of native coronary artery without angina pectoris: Secondary | ICD-10-CM

## 2020-12-16 DIAGNOSIS — U071 COVID-19: Secondary | ICD-10-CM

## 2020-12-16 DIAGNOSIS — J9621 Acute and chronic respiratory failure with hypoxia: Secondary | ICD-10-CM

## 2020-12-16 DIAGNOSIS — J449 Chronic obstructive pulmonary disease, unspecified: Secondary | ICD-10-CM

## 2020-12-17 DIAGNOSIS — J9621 Acute and chronic respiratory failure with hypoxia: Secondary | ICD-10-CM

## 2020-12-17 DIAGNOSIS — U071 COVID-19: Secondary | ICD-10-CM

## 2020-12-17 DIAGNOSIS — I2510 Atherosclerotic heart disease of native coronary artery without angina pectoris: Secondary | ICD-10-CM

## 2020-12-17 DIAGNOSIS — J449 Chronic obstructive pulmonary disease, unspecified: Secondary | ICD-10-CM

## 2020-12-18 DIAGNOSIS — U071 COVID-19: Secondary | ICD-10-CM

## 2020-12-18 DIAGNOSIS — I2510 Atherosclerotic heart disease of native coronary artery without angina pectoris: Secondary | ICD-10-CM

## 2020-12-18 DIAGNOSIS — J449 Chronic obstructive pulmonary disease, unspecified: Secondary | ICD-10-CM

## 2020-12-18 DIAGNOSIS — J9621 Acute and chronic respiratory failure with hypoxia: Secondary | ICD-10-CM

## 2020-12-19 DIAGNOSIS — J449 Chronic obstructive pulmonary disease, unspecified: Secondary | ICD-10-CM | POA: Diagnosis not present

## 2020-12-19 DIAGNOSIS — U071 COVID-19: Secondary | ICD-10-CM | POA: Diagnosis not present

## 2020-12-19 DIAGNOSIS — I2510 Atherosclerotic heart disease of native coronary artery without angina pectoris: Secondary | ICD-10-CM | POA: Diagnosis not present

## 2020-12-19 DIAGNOSIS — J9621 Acute and chronic respiratory failure with hypoxia: Secondary | ICD-10-CM | POA: Diagnosis not present

## 2020-12-29 DIAGNOSIS — U071 COVID-19: Secondary | ICD-10-CM | POA: Diagnosis not present

## 2020-12-29 DIAGNOSIS — I2510 Atherosclerotic heart disease of native coronary artery without angina pectoris: Secondary | ICD-10-CM | POA: Diagnosis not present

## 2020-12-29 DIAGNOSIS — J449 Chronic obstructive pulmonary disease, unspecified: Secondary | ICD-10-CM | POA: Diagnosis not present

## 2020-12-29 DIAGNOSIS — J9621 Acute and chronic respiratory failure with hypoxia: Secondary | ICD-10-CM | POA: Diagnosis not present

## 2020-12-30 DIAGNOSIS — J9621 Acute and chronic respiratory failure with hypoxia: Secondary | ICD-10-CM | POA: Diagnosis not present

## 2020-12-30 DIAGNOSIS — U071 COVID-19: Secondary | ICD-10-CM | POA: Diagnosis not present

## 2020-12-30 DIAGNOSIS — I2510 Atherosclerotic heart disease of native coronary artery without angina pectoris: Secondary | ICD-10-CM | POA: Diagnosis not present

## 2020-12-30 DIAGNOSIS — J449 Chronic obstructive pulmonary disease, unspecified: Secondary | ICD-10-CM | POA: Diagnosis not present

## 2020-12-31 DIAGNOSIS — I2510 Atherosclerotic heart disease of native coronary artery without angina pectoris: Secondary | ICD-10-CM | POA: Diagnosis not present

## 2020-12-31 DIAGNOSIS — J449 Chronic obstructive pulmonary disease, unspecified: Secondary | ICD-10-CM | POA: Diagnosis not present

## 2020-12-31 DIAGNOSIS — U071 COVID-19: Secondary | ICD-10-CM | POA: Diagnosis not present

## 2020-12-31 DIAGNOSIS — J9621 Acute and chronic respiratory failure with hypoxia: Secondary | ICD-10-CM | POA: Diagnosis not present

## 2021-01-01 DIAGNOSIS — U071 COVID-19: Secondary | ICD-10-CM | POA: Diagnosis not present

## 2021-01-01 DIAGNOSIS — J9621 Acute and chronic respiratory failure with hypoxia: Secondary | ICD-10-CM | POA: Diagnosis not present

## 2021-01-01 DIAGNOSIS — J449 Chronic obstructive pulmonary disease, unspecified: Secondary | ICD-10-CM | POA: Diagnosis not present

## 2021-01-01 DIAGNOSIS — I2510 Atherosclerotic heart disease of native coronary artery without angina pectoris: Secondary | ICD-10-CM | POA: Diagnosis not present

## 2021-01-12 DIAGNOSIS — J9621 Acute and chronic respiratory failure with hypoxia: Secondary | ICD-10-CM | POA: Diagnosis not present

## 2021-01-12 DIAGNOSIS — U071 COVID-19: Secondary | ICD-10-CM | POA: Diagnosis not present

## 2021-01-12 DIAGNOSIS — I2510 Atherosclerotic heart disease of native coronary artery without angina pectoris: Secondary | ICD-10-CM | POA: Diagnosis not present

## 2021-01-12 DIAGNOSIS — J449 Chronic obstructive pulmonary disease, unspecified: Secondary | ICD-10-CM | POA: Diagnosis not present

## 2021-01-13 DIAGNOSIS — I2510 Atherosclerotic heart disease of native coronary artery without angina pectoris: Secondary | ICD-10-CM | POA: Diagnosis not present

## 2021-01-13 DIAGNOSIS — J9621 Acute and chronic respiratory failure with hypoxia: Secondary | ICD-10-CM | POA: Diagnosis not present

## 2021-01-13 DIAGNOSIS — U071 COVID-19: Secondary | ICD-10-CM | POA: Diagnosis not present

## 2021-01-13 DIAGNOSIS — J449 Chronic obstructive pulmonary disease, unspecified: Secondary | ICD-10-CM | POA: Diagnosis not present

## 2021-01-14 DIAGNOSIS — I2510 Atherosclerotic heart disease of native coronary artery without angina pectoris: Secondary | ICD-10-CM | POA: Diagnosis not present

## 2021-01-14 DIAGNOSIS — J9621 Acute and chronic respiratory failure with hypoxia: Secondary | ICD-10-CM | POA: Diagnosis not present

## 2021-01-14 DIAGNOSIS — J449 Chronic obstructive pulmonary disease, unspecified: Secondary | ICD-10-CM | POA: Diagnosis not present

## 2021-01-14 DIAGNOSIS — U071 COVID-19: Secondary | ICD-10-CM | POA: Diagnosis not present

## 2021-01-15 DIAGNOSIS — U071 COVID-19: Secondary | ICD-10-CM | POA: Diagnosis not present

## 2021-01-15 DIAGNOSIS — I2510 Atherosclerotic heart disease of native coronary artery without angina pectoris: Secondary | ICD-10-CM | POA: Diagnosis not present

## 2021-01-15 DIAGNOSIS — J449 Chronic obstructive pulmonary disease, unspecified: Secondary | ICD-10-CM | POA: Diagnosis not present

## 2021-01-15 DIAGNOSIS — J9621 Acute and chronic respiratory failure with hypoxia: Secondary | ICD-10-CM | POA: Diagnosis not present

## 2021-01-16 DIAGNOSIS — U071 COVID-19: Secondary | ICD-10-CM | POA: Diagnosis not present

## 2021-01-16 DIAGNOSIS — J9621 Acute and chronic respiratory failure with hypoxia: Secondary | ICD-10-CM | POA: Diagnosis not present

## 2021-01-16 DIAGNOSIS — I2510 Atherosclerotic heart disease of native coronary artery without angina pectoris: Secondary | ICD-10-CM | POA: Diagnosis not present

## 2021-01-16 DIAGNOSIS — J449 Chronic obstructive pulmonary disease, unspecified: Secondary | ICD-10-CM | POA: Diagnosis not present

## 2021-01-17 DIAGNOSIS — J449 Chronic obstructive pulmonary disease, unspecified: Secondary | ICD-10-CM | POA: Diagnosis not present

## 2021-01-17 DIAGNOSIS — I2510 Atherosclerotic heart disease of native coronary artery without angina pectoris: Secondary | ICD-10-CM | POA: Diagnosis not present

## 2021-01-17 DIAGNOSIS — J9621 Acute and chronic respiratory failure with hypoxia: Secondary | ICD-10-CM | POA: Diagnosis not present

## 2021-01-17 DIAGNOSIS — U071 COVID-19: Secondary | ICD-10-CM | POA: Diagnosis not present

## 2021-01-18 DIAGNOSIS — J449 Chronic obstructive pulmonary disease, unspecified: Secondary | ICD-10-CM | POA: Diagnosis not present

## 2021-01-18 DIAGNOSIS — U071 COVID-19: Secondary | ICD-10-CM | POA: Diagnosis not present

## 2021-01-18 DIAGNOSIS — J9621 Acute and chronic respiratory failure with hypoxia: Secondary | ICD-10-CM | POA: Diagnosis not present

## 2021-01-18 DIAGNOSIS — I2510 Atherosclerotic heart disease of native coronary artery without angina pectoris: Secondary | ICD-10-CM | POA: Diagnosis not present

## 2021-01-26 DIAGNOSIS — J449 Chronic obstructive pulmonary disease, unspecified: Secondary | ICD-10-CM | POA: Diagnosis not present

## 2021-01-26 DIAGNOSIS — J9621 Acute and chronic respiratory failure with hypoxia: Secondary | ICD-10-CM | POA: Diagnosis not present

## 2021-01-26 DIAGNOSIS — U071 COVID-19: Secondary | ICD-10-CM | POA: Diagnosis not present

## 2021-01-26 DIAGNOSIS — I2510 Atherosclerotic heart disease of native coronary artery without angina pectoris: Secondary | ICD-10-CM | POA: Diagnosis not present

## 2021-01-27 DIAGNOSIS — J9621 Acute and chronic respiratory failure with hypoxia: Secondary | ICD-10-CM | POA: Diagnosis not present

## 2021-01-27 DIAGNOSIS — J449 Chronic obstructive pulmonary disease, unspecified: Secondary | ICD-10-CM | POA: Diagnosis not present

## 2021-01-27 DIAGNOSIS — U071 COVID-19: Secondary | ICD-10-CM | POA: Diagnosis not present

## 2021-01-27 DIAGNOSIS — I2510 Atherosclerotic heart disease of native coronary artery without angina pectoris: Secondary | ICD-10-CM | POA: Diagnosis not present

## 2021-01-28 DIAGNOSIS — U071 COVID-19: Secondary | ICD-10-CM | POA: Diagnosis not present

## 2021-01-28 DIAGNOSIS — J9621 Acute and chronic respiratory failure with hypoxia: Secondary | ICD-10-CM | POA: Diagnosis not present

## 2021-01-28 DIAGNOSIS — I2510 Atherosclerotic heart disease of native coronary artery without angina pectoris: Secondary | ICD-10-CM | POA: Diagnosis not present

## 2021-01-28 DIAGNOSIS — J449 Chronic obstructive pulmonary disease, unspecified: Secondary | ICD-10-CM | POA: Diagnosis not present

## 2021-01-29 DIAGNOSIS — J9621 Acute and chronic respiratory failure with hypoxia: Secondary | ICD-10-CM | POA: Diagnosis not present

## 2021-01-29 DIAGNOSIS — I2510 Atherosclerotic heart disease of native coronary artery without angina pectoris: Secondary | ICD-10-CM | POA: Diagnosis not present

## 2021-01-29 DIAGNOSIS — J449 Chronic obstructive pulmonary disease, unspecified: Secondary | ICD-10-CM | POA: Diagnosis not present

## 2021-01-29 DIAGNOSIS — U071 COVID-19: Secondary | ICD-10-CM | POA: Diagnosis not present

## 2021-01-30 DIAGNOSIS — I2510 Atherosclerotic heart disease of native coronary artery without angina pectoris: Secondary | ICD-10-CM | POA: Diagnosis not present

## 2021-01-30 DIAGNOSIS — J9621 Acute and chronic respiratory failure with hypoxia: Secondary | ICD-10-CM | POA: Diagnosis not present

## 2021-01-30 DIAGNOSIS — J449 Chronic obstructive pulmonary disease, unspecified: Secondary | ICD-10-CM | POA: Diagnosis not present

## 2021-01-30 DIAGNOSIS — U071 COVID-19: Secondary | ICD-10-CM | POA: Diagnosis not present

## 2021-01-31 DIAGNOSIS — J9621 Acute and chronic respiratory failure with hypoxia: Secondary | ICD-10-CM | POA: Diagnosis not present

## 2021-01-31 DIAGNOSIS — I2510 Atherosclerotic heart disease of native coronary artery without angina pectoris: Secondary | ICD-10-CM | POA: Diagnosis not present

## 2021-01-31 DIAGNOSIS — J449 Chronic obstructive pulmonary disease, unspecified: Secondary | ICD-10-CM | POA: Diagnosis not present

## 2021-01-31 DIAGNOSIS — U071 COVID-19: Secondary | ICD-10-CM | POA: Diagnosis not present

## 2021-02-01 DIAGNOSIS — J449 Chronic obstructive pulmonary disease, unspecified: Secondary | ICD-10-CM | POA: Diagnosis not present

## 2021-02-01 DIAGNOSIS — J9621 Acute and chronic respiratory failure with hypoxia: Secondary | ICD-10-CM | POA: Diagnosis not present

## 2021-02-01 DIAGNOSIS — U071 COVID-19: Secondary | ICD-10-CM | POA: Diagnosis not present

## 2021-02-01 DIAGNOSIS — I2510 Atherosclerotic heart disease of native coronary artery without angina pectoris: Secondary | ICD-10-CM | POA: Diagnosis not present

## 2021-02-09 DIAGNOSIS — U071 COVID-19: Secondary | ICD-10-CM

## 2021-02-09 DIAGNOSIS — J9621 Acute and chronic respiratory failure with hypoxia: Secondary | ICD-10-CM

## 2021-02-09 DIAGNOSIS — J449 Chronic obstructive pulmonary disease, unspecified: Secondary | ICD-10-CM

## 2021-02-09 DIAGNOSIS — I2510 Atherosclerotic heart disease of native coronary artery without angina pectoris: Secondary | ICD-10-CM

## 2021-02-10 DIAGNOSIS — J449 Chronic obstructive pulmonary disease, unspecified: Secondary | ICD-10-CM

## 2021-02-10 DIAGNOSIS — I2510 Atherosclerotic heart disease of native coronary artery without angina pectoris: Secondary | ICD-10-CM

## 2021-02-10 DIAGNOSIS — J9621 Acute and chronic respiratory failure with hypoxia: Secondary | ICD-10-CM

## 2021-02-10 DIAGNOSIS — U071 COVID-19: Secondary | ICD-10-CM

## 2021-02-11 DIAGNOSIS — I2510 Atherosclerotic heart disease of native coronary artery without angina pectoris: Secondary | ICD-10-CM

## 2021-02-11 DIAGNOSIS — J449 Chronic obstructive pulmonary disease, unspecified: Secondary | ICD-10-CM

## 2021-02-11 DIAGNOSIS — J9621 Acute and chronic respiratory failure with hypoxia: Secondary | ICD-10-CM

## 2021-02-11 DIAGNOSIS — U071 COVID-19: Secondary | ICD-10-CM

## 2021-02-12 DIAGNOSIS — J9621 Acute and chronic respiratory failure with hypoxia: Secondary | ICD-10-CM

## 2021-02-12 DIAGNOSIS — J449 Chronic obstructive pulmonary disease, unspecified: Secondary | ICD-10-CM

## 2021-02-12 DIAGNOSIS — U071 COVID-19: Secondary | ICD-10-CM

## 2021-02-12 DIAGNOSIS — I2510 Atherosclerotic heart disease of native coronary artery without angina pectoris: Secondary | ICD-10-CM

## 2021-02-13 DIAGNOSIS — J449 Chronic obstructive pulmonary disease, unspecified: Secondary | ICD-10-CM | POA: Diagnosis not present

## 2021-02-13 DIAGNOSIS — U071 COVID-19: Secondary | ICD-10-CM | POA: Diagnosis not present

## 2021-02-13 DIAGNOSIS — J9621 Acute and chronic respiratory failure with hypoxia: Secondary | ICD-10-CM | POA: Diagnosis not present

## 2021-02-13 DIAGNOSIS — I2510 Atherosclerotic heart disease of native coronary artery without angina pectoris: Secondary | ICD-10-CM | POA: Diagnosis not present

## 2021-02-14 DIAGNOSIS — J9621 Acute and chronic respiratory failure with hypoxia: Secondary | ICD-10-CM | POA: Diagnosis not present

## 2021-02-14 DIAGNOSIS — U071 COVID-19: Secondary | ICD-10-CM | POA: Diagnosis not present

## 2021-02-14 DIAGNOSIS — J449 Chronic obstructive pulmonary disease, unspecified: Secondary | ICD-10-CM | POA: Diagnosis not present

## 2021-02-14 DIAGNOSIS — I2510 Atherosclerotic heart disease of native coronary artery without angina pectoris: Secondary | ICD-10-CM | POA: Diagnosis not present

## 2021-02-15 DIAGNOSIS — I2510 Atherosclerotic heart disease of native coronary artery without angina pectoris: Secondary | ICD-10-CM | POA: Diagnosis not present

## 2021-02-15 DIAGNOSIS — J9621 Acute and chronic respiratory failure with hypoxia: Secondary | ICD-10-CM | POA: Diagnosis not present

## 2021-02-15 DIAGNOSIS — U071 COVID-19: Secondary | ICD-10-CM | POA: Diagnosis not present

## 2021-02-15 DIAGNOSIS — J449 Chronic obstructive pulmonary disease, unspecified: Secondary | ICD-10-CM | POA: Diagnosis not present

## 2021-02-23 DIAGNOSIS — U071 COVID-19: Secondary | ICD-10-CM | POA: Diagnosis not present

## 2021-02-23 DIAGNOSIS — I2510 Atherosclerotic heart disease of native coronary artery without angina pectoris: Secondary | ICD-10-CM | POA: Diagnosis not present

## 2021-02-23 DIAGNOSIS — J9621 Acute and chronic respiratory failure with hypoxia: Secondary | ICD-10-CM | POA: Diagnosis not present

## 2021-02-23 DIAGNOSIS — J449 Chronic obstructive pulmonary disease, unspecified: Secondary | ICD-10-CM | POA: Diagnosis not present

## 2021-02-24 DIAGNOSIS — J449 Chronic obstructive pulmonary disease, unspecified: Secondary | ICD-10-CM | POA: Diagnosis not present

## 2021-02-24 DIAGNOSIS — U071 COVID-19: Secondary | ICD-10-CM | POA: Diagnosis not present

## 2021-02-24 DIAGNOSIS — J9621 Acute and chronic respiratory failure with hypoxia: Secondary | ICD-10-CM | POA: Diagnosis not present

## 2021-02-24 DIAGNOSIS — I2510 Atherosclerotic heart disease of native coronary artery without angina pectoris: Secondary | ICD-10-CM

## 2021-02-25 DIAGNOSIS — I2510 Atherosclerotic heart disease of native coronary artery without angina pectoris: Secondary | ICD-10-CM | POA: Diagnosis not present

## 2021-02-25 DIAGNOSIS — J449 Chronic obstructive pulmonary disease, unspecified: Secondary | ICD-10-CM | POA: Diagnosis not present

## 2021-02-25 DIAGNOSIS — J9621 Acute and chronic respiratory failure with hypoxia: Secondary | ICD-10-CM | POA: Diagnosis not present

## 2021-02-25 DIAGNOSIS — U071 COVID-19: Secondary | ICD-10-CM | POA: Diagnosis not present

## 2021-02-26 DIAGNOSIS — I2510 Atherosclerotic heart disease of native coronary artery without angina pectoris: Secondary | ICD-10-CM | POA: Diagnosis not present

## 2021-02-26 DIAGNOSIS — U071 COVID-19: Secondary | ICD-10-CM | POA: Diagnosis not present

## 2021-02-26 DIAGNOSIS — J449 Chronic obstructive pulmonary disease, unspecified: Secondary | ICD-10-CM | POA: Diagnosis not present

## 2021-02-26 DIAGNOSIS — J9621 Acute and chronic respiratory failure with hypoxia: Secondary | ICD-10-CM | POA: Diagnosis not present

## 2021-03-06 DIAGNOSIS — J9621 Acute and chronic respiratory failure with hypoxia: Secondary | ICD-10-CM

## 2021-03-06 DIAGNOSIS — J449 Chronic obstructive pulmonary disease, unspecified: Secondary | ICD-10-CM

## 2021-03-06 DIAGNOSIS — U071 COVID-19: Secondary | ICD-10-CM

## 2021-03-06 DIAGNOSIS — I2510 Atherosclerotic heart disease of native coronary artery without angina pectoris: Secondary | ICD-10-CM

## 2021-03-07 DIAGNOSIS — I2510 Atherosclerotic heart disease of native coronary artery without angina pectoris: Secondary | ICD-10-CM

## 2021-03-07 DIAGNOSIS — U071 COVID-19: Secondary | ICD-10-CM

## 2021-03-07 DIAGNOSIS — J9621 Acute and chronic respiratory failure with hypoxia: Secondary | ICD-10-CM

## 2021-03-07 DIAGNOSIS — J449 Chronic obstructive pulmonary disease, unspecified: Secondary | ICD-10-CM

## 2021-03-08 DIAGNOSIS — U071 COVID-19: Secondary | ICD-10-CM

## 2021-03-08 DIAGNOSIS — J9621 Acute and chronic respiratory failure with hypoxia: Secondary | ICD-10-CM

## 2021-03-08 DIAGNOSIS — I2510 Atherosclerotic heart disease of native coronary artery without angina pectoris: Secondary | ICD-10-CM

## 2021-03-08 DIAGNOSIS — J449 Chronic obstructive pulmonary disease, unspecified: Secondary | ICD-10-CM

## 2021-03-09 DIAGNOSIS — I2510 Atherosclerotic heart disease of native coronary artery without angina pectoris: Secondary | ICD-10-CM

## 2021-03-09 DIAGNOSIS — U071 COVID-19: Secondary | ICD-10-CM

## 2021-03-09 DIAGNOSIS — J9621 Acute and chronic respiratory failure with hypoxia: Secondary | ICD-10-CM

## 2021-03-09 DIAGNOSIS — J449 Chronic obstructive pulmonary disease, unspecified: Secondary | ICD-10-CM

## 2021-03-10 DIAGNOSIS — U071 COVID-19: Secondary | ICD-10-CM

## 2021-03-10 DIAGNOSIS — J449 Chronic obstructive pulmonary disease, unspecified: Secondary | ICD-10-CM

## 2021-03-10 DIAGNOSIS — I2510 Atherosclerotic heart disease of native coronary artery without angina pectoris: Secondary | ICD-10-CM

## 2021-03-10 DIAGNOSIS — J9621 Acute and chronic respiratory failure with hypoxia: Secondary | ICD-10-CM

## 2021-03-11 DIAGNOSIS — J449 Chronic obstructive pulmonary disease, unspecified: Secondary | ICD-10-CM

## 2021-03-11 DIAGNOSIS — J9621 Acute and chronic respiratory failure with hypoxia: Secondary | ICD-10-CM

## 2021-03-11 DIAGNOSIS — U071 COVID-19: Secondary | ICD-10-CM

## 2021-03-11 DIAGNOSIS — I2510 Atherosclerotic heart disease of native coronary artery without angina pectoris: Secondary | ICD-10-CM

## 2021-03-12 DIAGNOSIS — J449 Chronic obstructive pulmonary disease, unspecified: Secondary | ICD-10-CM

## 2021-03-12 DIAGNOSIS — I2510 Atherosclerotic heart disease of native coronary artery without angina pectoris: Secondary | ICD-10-CM

## 2021-03-12 DIAGNOSIS — J9621 Acute and chronic respiratory failure with hypoxia: Secondary | ICD-10-CM

## 2021-03-12 DIAGNOSIS — U071 COVID-19: Secondary | ICD-10-CM

## 2021-03-13 DIAGNOSIS — J449 Chronic obstructive pulmonary disease, unspecified: Secondary | ICD-10-CM

## 2021-03-13 DIAGNOSIS — I2510 Atherosclerotic heart disease of native coronary artery without angina pectoris: Secondary | ICD-10-CM

## 2021-03-13 DIAGNOSIS — U071 COVID-19: Secondary | ICD-10-CM

## 2021-03-13 DIAGNOSIS — J9621 Acute and chronic respiratory failure with hypoxia: Secondary | ICD-10-CM

## 2021-03-14 DIAGNOSIS — U071 COVID-19: Secondary | ICD-10-CM

## 2021-03-14 DIAGNOSIS — I2510 Atherosclerotic heart disease of native coronary artery without angina pectoris: Secondary | ICD-10-CM

## 2021-03-14 DIAGNOSIS — J9621 Acute and chronic respiratory failure with hypoxia: Secondary | ICD-10-CM

## 2021-03-14 DIAGNOSIS — J449 Chronic obstructive pulmonary disease, unspecified: Secondary | ICD-10-CM

## 2021-03-15 DIAGNOSIS — J9621 Acute and chronic respiratory failure with hypoxia: Secondary | ICD-10-CM

## 2021-03-15 DIAGNOSIS — J449 Chronic obstructive pulmonary disease, unspecified: Secondary | ICD-10-CM

## 2021-03-15 DIAGNOSIS — I2510 Atherosclerotic heart disease of native coronary artery without angina pectoris: Secondary | ICD-10-CM

## 2021-03-15 DIAGNOSIS — U071 COVID-19: Secondary | ICD-10-CM

## 2021-03-23 DIAGNOSIS — U071 COVID-19: Secondary | ICD-10-CM

## 2021-03-23 DIAGNOSIS — J449 Chronic obstructive pulmonary disease, unspecified: Secondary | ICD-10-CM

## 2021-03-23 DIAGNOSIS — J9621 Acute and chronic respiratory failure with hypoxia: Secondary | ICD-10-CM

## 2021-03-23 DIAGNOSIS — I2510 Atherosclerotic heart disease of native coronary artery without angina pectoris: Secondary | ICD-10-CM

## 2021-03-24 DIAGNOSIS — I2510 Atherosclerotic heart disease of native coronary artery without angina pectoris: Secondary | ICD-10-CM

## 2021-03-24 DIAGNOSIS — J449 Chronic obstructive pulmonary disease, unspecified: Secondary | ICD-10-CM

## 2021-03-24 DIAGNOSIS — J9621 Acute and chronic respiratory failure with hypoxia: Secondary | ICD-10-CM

## 2021-03-24 DIAGNOSIS — U071 COVID-19: Secondary | ICD-10-CM

## 2021-03-25 DIAGNOSIS — J9621 Acute and chronic respiratory failure with hypoxia: Secondary | ICD-10-CM

## 2021-03-25 DIAGNOSIS — I2510 Atherosclerotic heart disease of native coronary artery without angina pectoris: Secondary | ICD-10-CM | POA: Diagnosis not present

## 2021-03-25 DIAGNOSIS — U071 COVID-19: Secondary | ICD-10-CM

## 2021-03-25 DIAGNOSIS — J449 Chronic obstructive pulmonary disease, unspecified: Secondary | ICD-10-CM

## 2021-03-26 DIAGNOSIS — U071 COVID-19: Secondary | ICD-10-CM

## 2021-03-26 DIAGNOSIS — J9621 Acute and chronic respiratory failure with hypoxia: Secondary | ICD-10-CM

## 2021-03-26 DIAGNOSIS — J449 Chronic obstructive pulmonary disease, unspecified: Secondary | ICD-10-CM

## 2021-03-26 DIAGNOSIS — I2510 Atherosclerotic heart disease of native coronary artery without angina pectoris: Secondary | ICD-10-CM

## 2021-03-27 DIAGNOSIS — U071 COVID-19: Secondary | ICD-10-CM

## 2021-03-27 DIAGNOSIS — J9621 Acute and chronic respiratory failure with hypoxia: Secondary | ICD-10-CM

## 2021-03-27 DIAGNOSIS — J449 Chronic obstructive pulmonary disease, unspecified: Secondary | ICD-10-CM

## 2021-03-27 DIAGNOSIS — I2510 Atherosclerotic heart disease of native coronary artery without angina pectoris: Secondary | ICD-10-CM

## 2021-03-28 DIAGNOSIS — I2510 Atherosclerotic heart disease of native coronary artery without angina pectoris: Secondary | ICD-10-CM

## 2021-03-28 DIAGNOSIS — J9621 Acute and chronic respiratory failure with hypoxia: Secondary | ICD-10-CM

## 2021-03-28 DIAGNOSIS — J449 Chronic obstructive pulmonary disease, unspecified: Secondary | ICD-10-CM

## 2021-03-28 DIAGNOSIS — U071 COVID-19: Secondary | ICD-10-CM

## 2021-03-29 DIAGNOSIS — J9621 Acute and chronic respiratory failure with hypoxia: Secondary | ICD-10-CM

## 2021-03-29 DIAGNOSIS — U071 COVID-19: Secondary | ICD-10-CM

## 2021-03-29 DIAGNOSIS — J449 Chronic obstructive pulmonary disease, unspecified: Secondary | ICD-10-CM

## 2021-03-29 DIAGNOSIS — I2510 Atherosclerotic heart disease of native coronary artery without angina pectoris: Secondary | ICD-10-CM

## 2021-04-06 DIAGNOSIS — I2510 Atherosclerotic heart disease of native coronary artery without angina pectoris: Secondary | ICD-10-CM | POA: Diagnosis not present

## 2021-04-06 DIAGNOSIS — J9621 Acute and chronic respiratory failure with hypoxia: Secondary | ICD-10-CM | POA: Diagnosis not present

## 2021-04-06 DIAGNOSIS — J449 Chronic obstructive pulmonary disease, unspecified: Secondary | ICD-10-CM | POA: Diagnosis not present

## 2021-04-06 DIAGNOSIS — U071 COVID-19: Secondary | ICD-10-CM | POA: Diagnosis not present

## 2021-04-07 DIAGNOSIS — U071 COVID-19: Secondary | ICD-10-CM

## 2021-04-07 DIAGNOSIS — I2510 Atherosclerotic heart disease of native coronary artery without angina pectoris: Secondary | ICD-10-CM | POA: Diagnosis not present

## 2021-04-07 DIAGNOSIS — J449 Chronic obstructive pulmonary disease, unspecified: Secondary | ICD-10-CM

## 2021-04-07 DIAGNOSIS — J9621 Acute and chronic respiratory failure with hypoxia: Secondary | ICD-10-CM | POA: Diagnosis not present

## 2021-04-08 DIAGNOSIS — U071 COVID-19: Secondary | ICD-10-CM | POA: Diagnosis not present

## 2021-04-08 DIAGNOSIS — J449 Chronic obstructive pulmonary disease, unspecified: Secondary | ICD-10-CM | POA: Diagnosis not present

## 2021-04-08 DIAGNOSIS — I2510 Atherosclerotic heart disease of native coronary artery without angina pectoris: Secondary | ICD-10-CM | POA: Diagnosis not present

## 2021-04-08 DIAGNOSIS — J9621 Acute and chronic respiratory failure with hypoxia: Secondary | ICD-10-CM | POA: Diagnosis not present

## 2021-04-09 DIAGNOSIS — U071 COVID-19: Secondary | ICD-10-CM | POA: Diagnosis not present

## 2021-04-09 DIAGNOSIS — J9621 Acute and chronic respiratory failure with hypoxia: Secondary | ICD-10-CM | POA: Diagnosis not present

## 2021-04-09 DIAGNOSIS — J449 Chronic obstructive pulmonary disease, unspecified: Secondary | ICD-10-CM | POA: Diagnosis not present

## 2021-04-09 DIAGNOSIS — I2510 Atherosclerotic heart disease of native coronary artery without angina pectoris: Secondary | ICD-10-CM | POA: Diagnosis not present

## 2021-04-10 DIAGNOSIS — U071 COVID-19: Secondary | ICD-10-CM | POA: Diagnosis not present

## 2021-04-10 DIAGNOSIS — J9621 Acute and chronic respiratory failure with hypoxia: Secondary | ICD-10-CM | POA: Diagnosis not present

## 2021-04-10 DIAGNOSIS — I2510 Atherosclerotic heart disease of native coronary artery without angina pectoris: Secondary | ICD-10-CM | POA: Diagnosis not present

## 2021-04-10 DIAGNOSIS — J449 Chronic obstructive pulmonary disease, unspecified: Secondary | ICD-10-CM | POA: Diagnosis not present

## 2021-04-19 DIAGNOSIS — I2510 Atherosclerotic heart disease of native coronary artery without angina pectoris: Secondary | ICD-10-CM | POA: Diagnosis not present

## 2021-04-19 DIAGNOSIS — J449 Chronic obstructive pulmonary disease, unspecified: Secondary | ICD-10-CM | POA: Diagnosis not present

## 2021-04-19 DIAGNOSIS — U071 COVID-19: Secondary | ICD-10-CM | POA: Diagnosis not present

## 2021-04-19 DIAGNOSIS — J9621 Acute and chronic respiratory failure with hypoxia: Secondary | ICD-10-CM | POA: Diagnosis not present

## 2021-04-20 DIAGNOSIS — I2510 Atherosclerotic heart disease of native coronary artery without angina pectoris: Secondary | ICD-10-CM | POA: Diagnosis not present

## 2021-04-20 DIAGNOSIS — J9621 Acute and chronic respiratory failure with hypoxia: Secondary | ICD-10-CM | POA: Diagnosis not present

## 2021-04-20 DIAGNOSIS — U071 COVID-19: Secondary | ICD-10-CM | POA: Diagnosis not present

## 2021-04-20 DIAGNOSIS — J449 Chronic obstructive pulmonary disease, unspecified: Secondary | ICD-10-CM | POA: Diagnosis not present

## 2021-04-21 DIAGNOSIS — I2510 Atherosclerotic heart disease of native coronary artery without angina pectoris: Secondary | ICD-10-CM | POA: Diagnosis not present

## 2021-04-21 DIAGNOSIS — J9621 Acute and chronic respiratory failure with hypoxia: Secondary | ICD-10-CM | POA: Diagnosis not present

## 2021-04-21 DIAGNOSIS — J449 Chronic obstructive pulmonary disease, unspecified: Secondary | ICD-10-CM | POA: Diagnosis not present

## 2021-04-21 DIAGNOSIS — U071 COVID-19: Secondary | ICD-10-CM | POA: Diagnosis not present

## 2021-04-22 DIAGNOSIS — J449 Chronic obstructive pulmonary disease, unspecified: Secondary | ICD-10-CM

## 2021-04-22 DIAGNOSIS — I2510 Atherosclerotic heart disease of native coronary artery without angina pectoris: Secondary | ICD-10-CM | POA: Diagnosis not present

## 2021-04-22 DIAGNOSIS — U071 COVID-19: Secondary | ICD-10-CM

## 2021-04-22 DIAGNOSIS — J9621 Acute and chronic respiratory failure with hypoxia: Secondary | ICD-10-CM

## 2021-04-23 DIAGNOSIS — U071 COVID-19: Secondary | ICD-10-CM | POA: Diagnosis not present

## 2021-04-23 DIAGNOSIS — J449 Chronic obstructive pulmonary disease, unspecified: Secondary | ICD-10-CM | POA: Diagnosis not present

## 2021-04-23 DIAGNOSIS — I2510 Atherosclerotic heart disease of native coronary artery without angina pectoris: Secondary | ICD-10-CM

## 2021-04-23 DIAGNOSIS — J9621 Acute and chronic respiratory failure with hypoxia: Secondary | ICD-10-CM

## 2021-04-24 DIAGNOSIS — U071 COVID-19: Secondary | ICD-10-CM

## 2021-04-24 DIAGNOSIS — J9621 Acute and chronic respiratory failure with hypoxia: Secondary | ICD-10-CM

## 2021-04-24 DIAGNOSIS — J449 Chronic obstructive pulmonary disease, unspecified: Secondary | ICD-10-CM

## 2021-04-24 DIAGNOSIS — I2510 Atherosclerotic heart disease of native coronary artery without angina pectoris: Secondary | ICD-10-CM

## 2021-04-25 DIAGNOSIS — I2510 Atherosclerotic heart disease of native coronary artery without angina pectoris: Secondary | ICD-10-CM

## 2021-04-25 DIAGNOSIS — U071 COVID-19: Secondary | ICD-10-CM

## 2021-04-25 DIAGNOSIS — J449 Chronic obstructive pulmonary disease, unspecified: Secondary | ICD-10-CM

## 2021-04-25 DIAGNOSIS — J9621 Acute and chronic respiratory failure with hypoxia: Secondary | ICD-10-CM

## 2021-04-26 DIAGNOSIS — J9621 Acute and chronic respiratory failure with hypoxia: Secondary | ICD-10-CM

## 2021-04-26 DIAGNOSIS — I2510 Atherosclerotic heart disease of native coronary artery without angina pectoris: Secondary | ICD-10-CM

## 2021-04-26 DIAGNOSIS — U071 COVID-19: Secondary | ICD-10-CM

## 2021-04-26 DIAGNOSIS — J449 Chronic obstructive pulmonary disease, unspecified: Secondary | ICD-10-CM

## 2021-05-04 DIAGNOSIS — I2510 Atherosclerotic heart disease of native coronary artery without angina pectoris: Secondary | ICD-10-CM

## 2021-05-04 DIAGNOSIS — J9621 Acute and chronic respiratory failure with hypoxia: Secondary | ICD-10-CM

## 2021-05-04 DIAGNOSIS — U071 COVID-19: Secondary | ICD-10-CM

## 2021-05-04 DIAGNOSIS — J449 Chronic obstructive pulmonary disease, unspecified: Secondary | ICD-10-CM

## 2021-05-05 DIAGNOSIS — J449 Chronic obstructive pulmonary disease, unspecified: Secondary | ICD-10-CM

## 2021-05-05 DIAGNOSIS — I2510 Atherosclerotic heart disease of native coronary artery without angina pectoris: Secondary | ICD-10-CM

## 2021-05-05 DIAGNOSIS — J9621 Acute and chronic respiratory failure with hypoxia: Secondary | ICD-10-CM

## 2021-05-05 DIAGNOSIS — U071 COVID-19: Secondary | ICD-10-CM

## 2021-05-06 DIAGNOSIS — J449 Chronic obstructive pulmonary disease, unspecified: Secondary | ICD-10-CM | POA: Diagnosis not present

## 2021-05-06 DIAGNOSIS — I2510 Atherosclerotic heart disease of native coronary artery without angina pectoris: Secondary | ICD-10-CM | POA: Diagnosis not present

## 2021-05-06 DIAGNOSIS — U071 COVID-19: Secondary | ICD-10-CM | POA: Diagnosis not present

## 2021-05-06 DIAGNOSIS — J9621 Acute and chronic respiratory failure with hypoxia: Secondary | ICD-10-CM | POA: Diagnosis not present

## 2021-05-07 DIAGNOSIS — J9621 Acute and chronic respiratory failure with hypoxia: Secondary | ICD-10-CM | POA: Diagnosis not present

## 2021-05-07 DIAGNOSIS — U071 COVID-19: Secondary | ICD-10-CM | POA: Diagnosis not present

## 2021-05-07 DIAGNOSIS — J449 Chronic obstructive pulmonary disease, unspecified: Secondary | ICD-10-CM | POA: Diagnosis not present

## 2021-05-07 DIAGNOSIS — I2510 Atherosclerotic heart disease of native coronary artery without angina pectoris: Secondary | ICD-10-CM | POA: Diagnosis not present

## 2021-05-08 DIAGNOSIS — J449 Chronic obstructive pulmonary disease, unspecified: Secondary | ICD-10-CM | POA: Diagnosis not present

## 2021-05-08 DIAGNOSIS — U071 COVID-19: Secondary | ICD-10-CM | POA: Diagnosis not present

## 2021-05-08 DIAGNOSIS — J9621 Acute and chronic respiratory failure with hypoxia: Secondary | ICD-10-CM | POA: Diagnosis not present

## 2021-05-08 DIAGNOSIS — I2510 Atherosclerotic heart disease of native coronary artery without angina pectoris: Secondary | ICD-10-CM | POA: Diagnosis not present

## 2021-05-09 DIAGNOSIS — I2510 Atherosclerotic heart disease of native coronary artery without angina pectoris: Secondary | ICD-10-CM | POA: Diagnosis not present

## 2021-05-09 DIAGNOSIS — U071 COVID-19: Secondary | ICD-10-CM | POA: Diagnosis not present

## 2021-05-09 DIAGNOSIS — J9621 Acute and chronic respiratory failure with hypoxia: Secondary | ICD-10-CM | POA: Diagnosis not present

## 2021-05-09 DIAGNOSIS — J449 Chronic obstructive pulmonary disease, unspecified: Secondary | ICD-10-CM | POA: Diagnosis not present

## 2021-05-10 DIAGNOSIS — J449 Chronic obstructive pulmonary disease, unspecified: Secondary | ICD-10-CM | POA: Diagnosis not present

## 2021-05-10 DIAGNOSIS — I2510 Atherosclerotic heart disease of native coronary artery without angina pectoris: Secondary | ICD-10-CM | POA: Diagnosis not present

## 2021-05-10 DIAGNOSIS — U071 COVID-19: Secondary | ICD-10-CM | POA: Diagnosis not present

## 2021-05-10 DIAGNOSIS — J9621 Acute and chronic respiratory failure with hypoxia: Secondary | ICD-10-CM | POA: Diagnosis not present

## 2021-05-18 DIAGNOSIS — J449 Chronic obstructive pulmonary disease, unspecified: Secondary | ICD-10-CM | POA: Diagnosis not present

## 2021-05-18 DIAGNOSIS — U071 COVID-19: Secondary | ICD-10-CM | POA: Diagnosis not present

## 2021-05-18 DIAGNOSIS — I2510 Atherosclerotic heart disease of native coronary artery without angina pectoris: Secondary | ICD-10-CM | POA: Diagnosis not present

## 2021-05-18 DIAGNOSIS — J9621 Acute and chronic respiratory failure with hypoxia: Secondary | ICD-10-CM | POA: Diagnosis not present

## 2021-05-19 DIAGNOSIS — J9621 Acute and chronic respiratory failure with hypoxia: Secondary | ICD-10-CM | POA: Diagnosis not present

## 2021-05-19 DIAGNOSIS — J449 Chronic obstructive pulmonary disease, unspecified: Secondary | ICD-10-CM | POA: Diagnosis not present

## 2021-05-19 DIAGNOSIS — U071 COVID-19: Secondary | ICD-10-CM | POA: Diagnosis not present

## 2021-05-19 DIAGNOSIS — I2510 Atherosclerotic heart disease of native coronary artery without angina pectoris: Secondary | ICD-10-CM | POA: Diagnosis not present

## 2021-05-20 DIAGNOSIS — U071 COVID-19: Secondary | ICD-10-CM | POA: Diagnosis not present

## 2021-05-20 DIAGNOSIS — I2510 Atherosclerotic heart disease of native coronary artery without angina pectoris: Secondary | ICD-10-CM | POA: Diagnosis not present

## 2021-05-20 DIAGNOSIS — J9621 Acute and chronic respiratory failure with hypoxia: Secondary | ICD-10-CM | POA: Diagnosis not present

## 2021-05-20 DIAGNOSIS — J449 Chronic obstructive pulmonary disease, unspecified: Secondary | ICD-10-CM | POA: Diagnosis not present

## 2021-05-21 DIAGNOSIS — J449 Chronic obstructive pulmonary disease, unspecified: Secondary | ICD-10-CM | POA: Diagnosis not present

## 2021-05-21 DIAGNOSIS — U071 COVID-19: Secondary | ICD-10-CM | POA: Diagnosis not present

## 2021-05-21 DIAGNOSIS — I2510 Atherosclerotic heart disease of native coronary artery without angina pectoris: Secondary | ICD-10-CM | POA: Diagnosis not present

## 2021-05-21 DIAGNOSIS — J9621 Acute and chronic respiratory failure with hypoxia: Secondary | ICD-10-CM | POA: Diagnosis not present

## 2021-05-22 DIAGNOSIS — J9621 Acute and chronic respiratory failure with hypoxia: Secondary | ICD-10-CM | POA: Diagnosis not present

## 2021-05-22 DIAGNOSIS — U071 COVID-19: Secondary | ICD-10-CM | POA: Diagnosis not present

## 2021-05-22 DIAGNOSIS — J449 Chronic obstructive pulmonary disease, unspecified: Secondary | ICD-10-CM | POA: Diagnosis not present

## 2021-05-22 DIAGNOSIS — I2510 Atherosclerotic heart disease of native coronary artery without angina pectoris: Secondary | ICD-10-CM | POA: Diagnosis not present

## 2021-05-23 DIAGNOSIS — J449 Chronic obstructive pulmonary disease, unspecified: Secondary | ICD-10-CM | POA: Diagnosis not present

## 2021-05-23 DIAGNOSIS — U071 COVID-19: Secondary | ICD-10-CM | POA: Diagnosis not present

## 2021-05-23 DIAGNOSIS — J9621 Acute and chronic respiratory failure with hypoxia: Secondary | ICD-10-CM | POA: Diagnosis not present

## 2021-05-23 DIAGNOSIS — I2510 Atherosclerotic heart disease of native coronary artery without angina pectoris: Secondary | ICD-10-CM | POA: Diagnosis not present

## 2021-05-24 DIAGNOSIS — J9621 Acute and chronic respiratory failure with hypoxia: Secondary | ICD-10-CM | POA: Diagnosis not present

## 2021-05-24 DIAGNOSIS — U071 COVID-19: Secondary | ICD-10-CM | POA: Diagnosis not present

## 2021-05-24 DIAGNOSIS — I2510 Atherosclerotic heart disease of native coronary artery without angina pectoris: Secondary | ICD-10-CM | POA: Diagnosis not present

## 2021-05-24 DIAGNOSIS — J449 Chronic obstructive pulmonary disease, unspecified: Secondary | ICD-10-CM | POA: Diagnosis not present

## 2021-06-01 DIAGNOSIS — I2510 Atherosclerotic heart disease of native coronary artery without angina pectoris: Secondary | ICD-10-CM | POA: Diagnosis not present

## 2021-06-01 DIAGNOSIS — J449 Chronic obstructive pulmonary disease, unspecified: Secondary | ICD-10-CM | POA: Diagnosis not present

## 2021-06-01 DIAGNOSIS — J9621 Acute and chronic respiratory failure with hypoxia: Secondary | ICD-10-CM | POA: Diagnosis not present

## 2021-06-01 DIAGNOSIS — U071 COVID-19: Secondary | ICD-10-CM | POA: Diagnosis not present

## 2021-06-02 DIAGNOSIS — U071 COVID-19: Secondary | ICD-10-CM | POA: Diagnosis not present

## 2021-06-02 DIAGNOSIS — J9621 Acute and chronic respiratory failure with hypoxia: Secondary | ICD-10-CM | POA: Diagnosis not present

## 2021-06-02 DIAGNOSIS — I2510 Atherosclerotic heart disease of native coronary artery without angina pectoris: Secondary | ICD-10-CM | POA: Diagnosis not present

## 2021-06-02 DIAGNOSIS — J449 Chronic obstructive pulmonary disease, unspecified: Secondary | ICD-10-CM | POA: Diagnosis not present

## 2021-06-03 DIAGNOSIS — U071 COVID-19: Secondary | ICD-10-CM | POA: Diagnosis not present

## 2021-06-03 DIAGNOSIS — J9621 Acute and chronic respiratory failure with hypoxia: Secondary | ICD-10-CM | POA: Diagnosis not present

## 2021-06-03 DIAGNOSIS — I2510 Atherosclerotic heart disease of native coronary artery without angina pectoris: Secondary | ICD-10-CM | POA: Diagnosis not present

## 2021-06-03 DIAGNOSIS — J449 Chronic obstructive pulmonary disease, unspecified: Secondary | ICD-10-CM | POA: Diagnosis not present

## 2021-06-04 DIAGNOSIS — J9621 Acute and chronic respiratory failure with hypoxia: Secondary | ICD-10-CM | POA: Diagnosis not present

## 2021-06-04 DIAGNOSIS — J449 Chronic obstructive pulmonary disease, unspecified: Secondary | ICD-10-CM | POA: Diagnosis not present

## 2021-06-04 DIAGNOSIS — I2510 Atherosclerotic heart disease of native coronary artery without angina pectoris: Secondary | ICD-10-CM | POA: Diagnosis not present

## 2021-06-04 DIAGNOSIS — U071 COVID-19: Secondary | ICD-10-CM | POA: Diagnosis not present

## 2021-06-05 DIAGNOSIS — U071 COVID-19: Secondary | ICD-10-CM | POA: Diagnosis not present

## 2021-06-05 DIAGNOSIS — I2510 Atherosclerotic heart disease of native coronary artery without angina pectoris: Secondary | ICD-10-CM | POA: Diagnosis not present

## 2021-06-05 DIAGNOSIS — J9621 Acute and chronic respiratory failure with hypoxia: Secondary | ICD-10-CM | POA: Diagnosis not present

## 2021-06-05 DIAGNOSIS — J449 Chronic obstructive pulmonary disease, unspecified: Secondary | ICD-10-CM | POA: Diagnosis not present

## 2021-06-06 DIAGNOSIS — J9621 Acute and chronic respiratory failure with hypoxia: Secondary | ICD-10-CM | POA: Diagnosis not present

## 2021-06-06 DIAGNOSIS — I2510 Atherosclerotic heart disease of native coronary artery without angina pectoris: Secondary | ICD-10-CM | POA: Diagnosis not present

## 2021-06-06 DIAGNOSIS — J449 Chronic obstructive pulmonary disease, unspecified: Secondary | ICD-10-CM | POA: Diagnosis not present

## 2021-06-06 DIAGNOSIS — U071 COVID-19: Secondary | ICD-10-CM | POA: Diagnosis not present

## 2021-06-07 DIAGNOSIS — U071 COVID-19: Secondary | ICD-10-CM | POA: Diagnosis not present

## 2021-06-07 DIAGNOSIS — I2510 Atherosclerotic heart disease of native coronary artery without angina pectoris: Secondary | ICD-10-CM | POA: Diagnosis not present

## 2021-06-07 DIAGNOSIS — J9621 Acute and chronic respiratory failure with hypoxia: Secondary | ICD-10-CM | POA: Diagnosis not present

## 2021-06-07 DIAGNOSIS — J449 Chronic obstructive pulmonary disease, unspecified: Secondary | ICD-10-CM | POA: Diagnosis not present

## 2021-06-15 DIAGNOSIS — J9621 Acute and chronic respiratory failure with hypoxia: Secondary | ICD-10-CM | POA: Diagnosis not present

## 2021-06-15 DIAGNOSIS — I2510 Atherosclerotic heart disease of native coronary artery without angina pectoris: Secondary | ICD-10-CM | POA: Diagnosis not present

## 2021-06-15 DIAGNOSIS — U071 COVID-19: Secondary | ICD-10-CM | POA: Diagnosis not present

## 2021-06-15 DIAGNOSIS — J449 Chronic obstructive pulmonary disease, unspecified: Secondary | ICD-10-CM | POA: Diagnosis not present

## 2021-06-16 DIAGNOSIS — I2510 Atherosclerotic heart disease of native coronary artery without angina pectoris: Secondary | ICD-10-CM | POA: Diagnosis not present

## 2021-06-16 DIAGNOSIS — J449 Chronic obstructive pulmonary disease, unspecified: Secondary | ICD-10-CM | POA: Diagnosis not present

## 2021-06-16 DIAGNOSIS — U071 COVID-19: Secondary | ICD-10-CM | POA: Diagnosis not present

## 2021-06-16 DIAGNOSIS — J9621 Acute and chronic respiratory failure with hypoxia: Secondary | ICD-10-CM | POA: Diagnosis not present

## 2021-06-17 DIAGNOSIS — U071 COVID-19: Secondary | ICD-10-CM | POA: Diagnosis not present

## 2021-06-17 DIAGNOSIS — J9621 Acute and chronic respiratory failure with hypoxia: Secondary | ICD-10-CM | POA: Diagnosis not present

## 2021-06-17 DIAGNOSIS — I2510 Atherosclerotic heart disease of native coronary artery without angina pectoris: Secondary | ICD-10-CM | POA: Diagnosis not present

## 2021-06-17 DIAGNOSIS — J449 Chronic obstructive pulmonary disease, unspecified: Secondary | ICD-10-CM | POA: Diagnosis not present

## 2021-06-18 DIAGNOSIS — J449 Chronic obstructive pulmonary disease, unspecified: Secondary | ICD-10-CM | POA: Diagnosis not present

## 2021-06-18 DIAGNOSIS — U071 COVID-19: Secondary | ICD-10-CM | POA: Diagnosis not present

## 2021-06-18 DIAGNOSIS — I2510 Atherosclerotic heart disease of native coronary artery without angina pectoris: Secondary | ICD-10-CM | POA: Diagnosis not present

## 2021-06-18 DIAGNOSIS — J9621 Acute and chronic respiratory failure with hypoxia: Secondary | ICD-10-CM | POA: Diagnosis not present

## 2021-06-19 DIAGNOSIS — J449 Chronic obstructive pulmonary disease, unspecified: Secondary | ICD-10-CM | POA: Diagnosis not present

## 2021-06-19 DIAGNOSIS — I2510 Atherosclerotic heart disease of native coronary artery without angina pectoris: Secondary | ICD-10-CM | POA: Diagnosis not present

## 2021-06-19 DIAGNOSIS — U071 COVID-19: Secondary | ICD-10-CM | POA: Diagnosis not present

## 2021-06-19 DIAGNOSIS — J9621 Acute and chronic respiratory failure with hypoxia: Secondary | ICD-10-CM | POA: Diagnosis not present

## 2021-06-20 DIAGNOSIS — J449 Chronic obstructive pulmonary disease, unspecified: Secondary | ICD-10-CM | POA: Diagnosis not present

## 2021-06-20 DIAGNOSIS — U071 COVID-19: Secondary | ICD-10-CM | POA: Diagnosis not present

## 2021-06-20 DIAGNOSIS — I2510 Atherosclerotic heart disease of native coronary artery without angina pectoris: Secondary | ICD-10-CM | POA: Diagnosis not present

## 2021-06-20 DIAGNOSIS — J9621 Acute and chronic respiratory failure with hypoxia: Secondary | ICD-10-CM | POA: Diagnosis not present

## 2021-06-21 DIAGNOSIS — J449 Chronic obstructive pulmonary disease, unspecified: Secondary | ICD-10-CM | POA: Diagnosis not present

## 2021-06-21 DIAGNOSIS — I2510 Atherosclerotic heart disease of native coronary artery without angina pectoris: Secondary | ICD-10-CM | POA: Diagnosis not present

## 2021-06-21 DIAGNOSIS — J9621 Acute and chronic respiratory failure with hypoxia: Secondary | ICD-10-CM | POA: Diagnosis not present

## 2021-06-21 DIAGNOSIS — U071 COVID-19: Secondary | ICD-10-CM | POA: Diagnosis not present

## 2022-09-14 IMAGING — RF DG C-ARM 1-60 MIN
1 series · 8 of 8 positions shown · non-contrast
Comparison: None.

CLINICAL DATA: Open treatment of Dritero ankle fracture.

EXAM:
DG C-ARM 1-60 MIN; RIGHT ANKLE - COMPLETE 3+ VIEW
FLUOROSCOPY TIME:  Fluoroscopy Time:  1 minutes and 43 seconds
Radiation Exposure Index (if provided by the fluoroscopic device):
1.85 mGy
Number of Acquired Spot Images: 8

[Series 1: run · 8 of 8 slices shown]
[im 1/8]
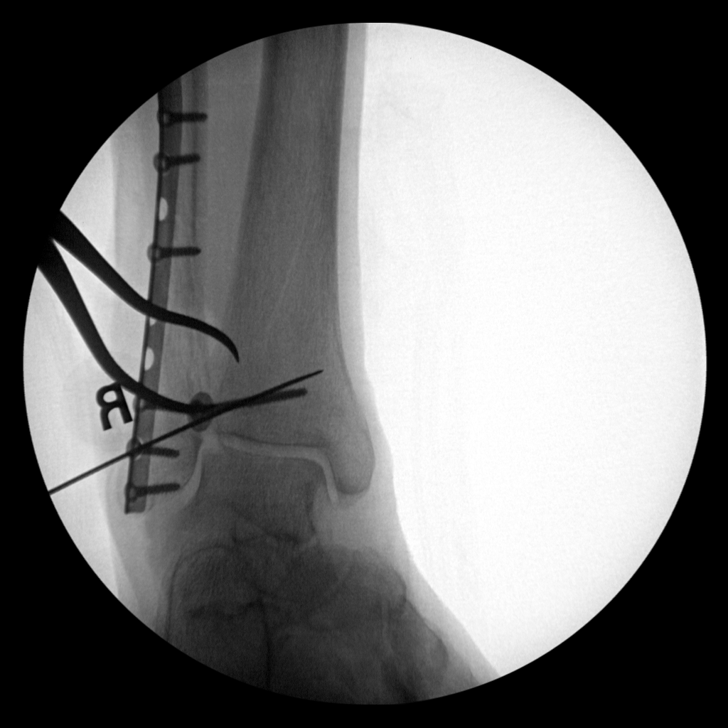
[im 2/8]
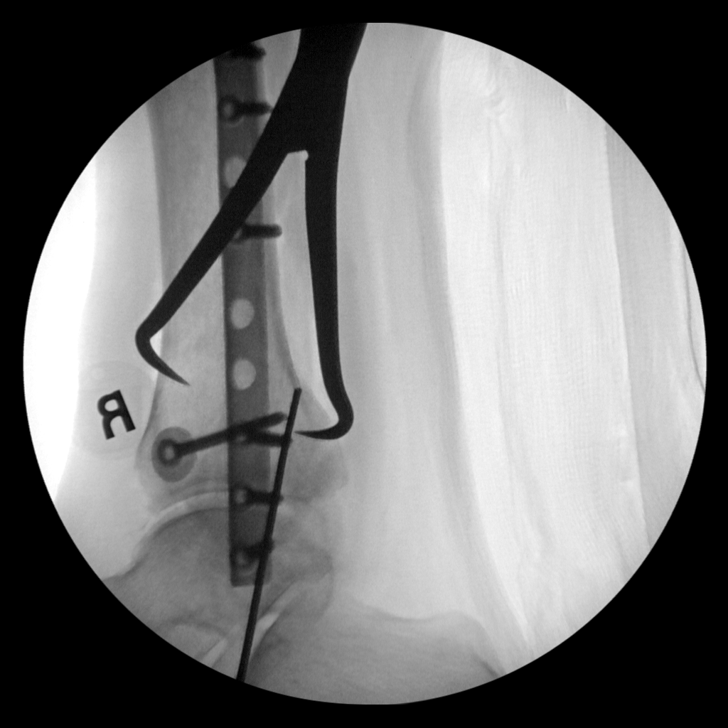
[im 3/8]
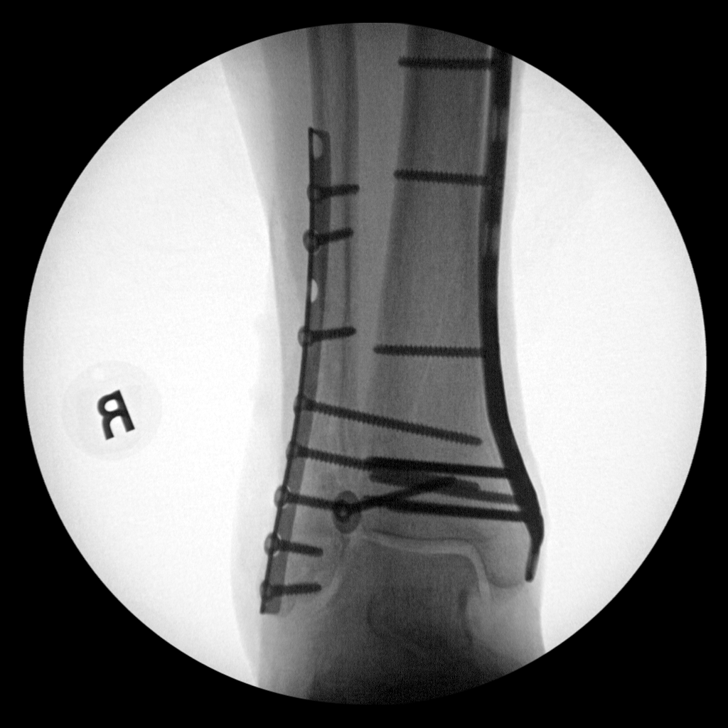
[im 4/8]
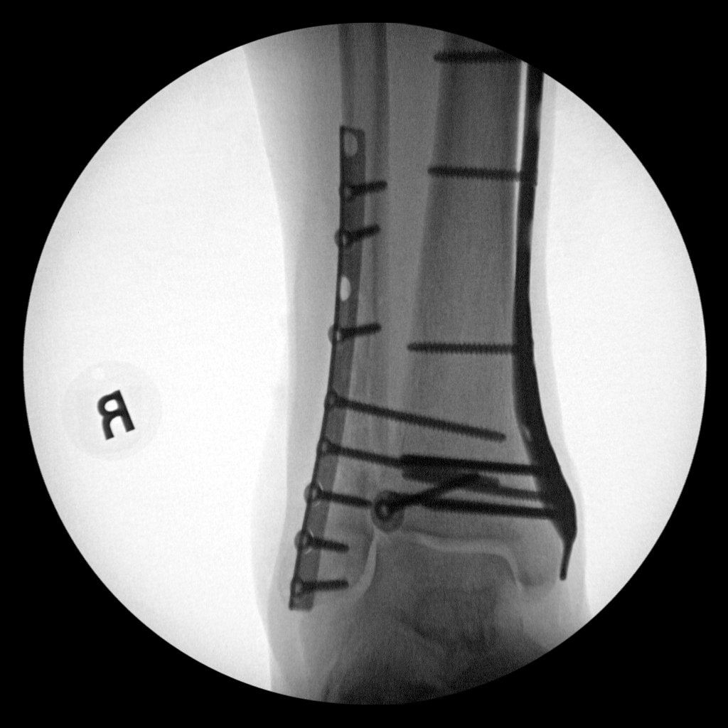
[im 5/8]
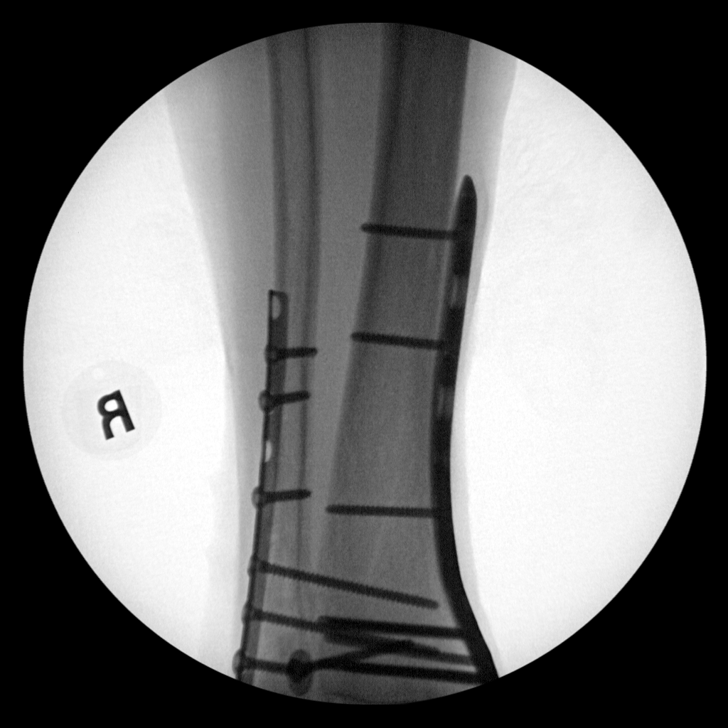
[im 6/8]
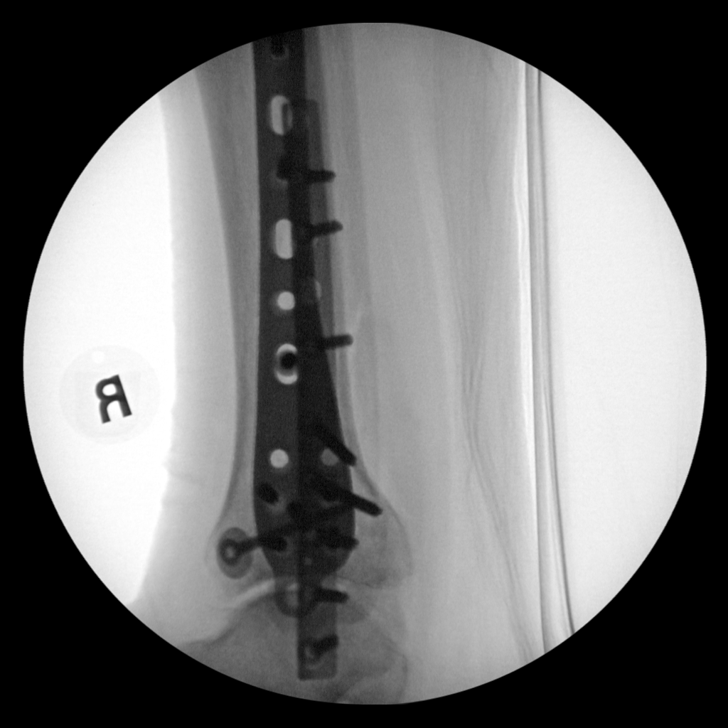
[im 7/8]
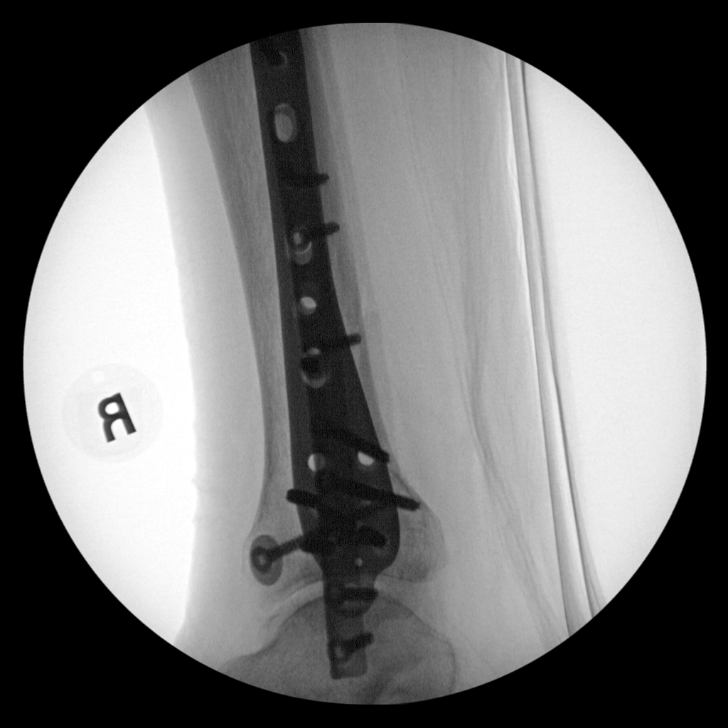
[im 8/8]
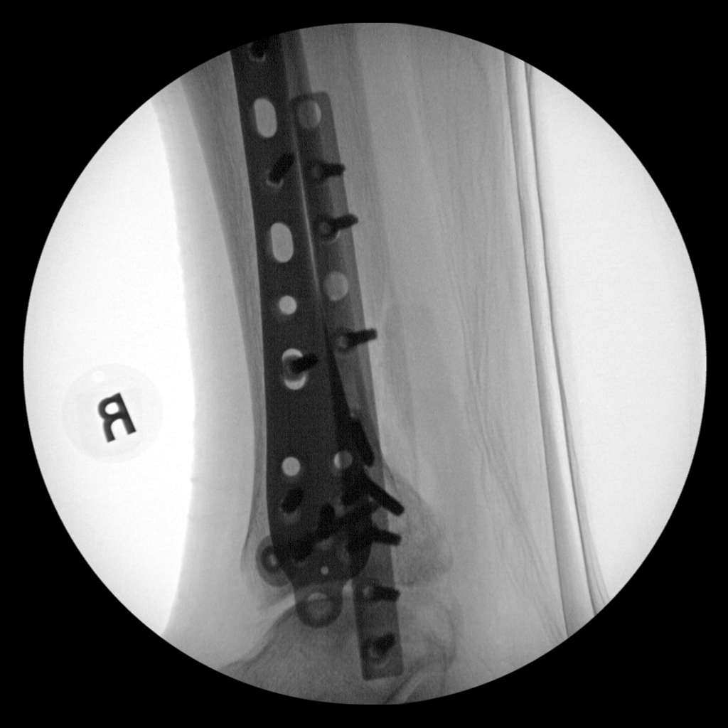

[8 of 8 positions shown; findings below may reference images not displayed]

FINDINGS: Eight C-arm fluoroscopic images were obtained intraoperatively and
submitted for post operative interpretation. These images
demonstrate plate and screw fixation of the distal fibula and tibia
and placement of two screws across the syndesmosis. Please see the
performing provider's procedural report for further detail.
IMPRESSION: Intraoperative fluoroscopy, as detailed above.

## 2022-09-14 IMAGING — RF DG ANKLE COMPLETE 3+V*R*
1 series · 8 of 8 positions shown · non-contrast
Comparison: None.

CLINICAL DATA: Open treatment of Dritero ankle fracture.

EXAM:
DG C-ARM 1-60 MIN; RIGHT ANKLE - COMPLETE 3+ VIEW
FLUOROSCOPY TIME:  Fluoroscopy Time:  1 minutes and 43 seconds
Radiation Exposure Index (if provided by the fluoroscopic device):
1.85 mGy
Number of Acquired Spot Images: 8

[Series 1: run · 8 of 8 slices shown]
[im 1/8]
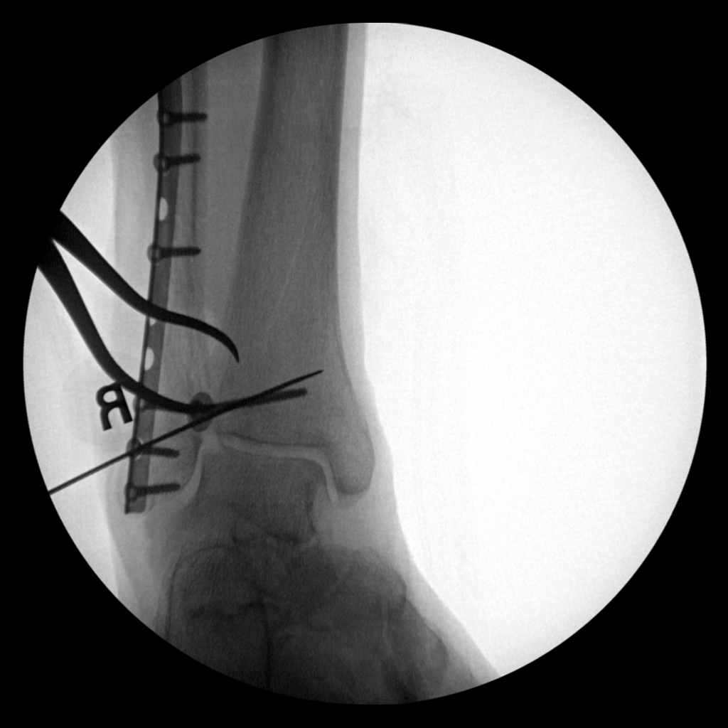
[im 2/8]
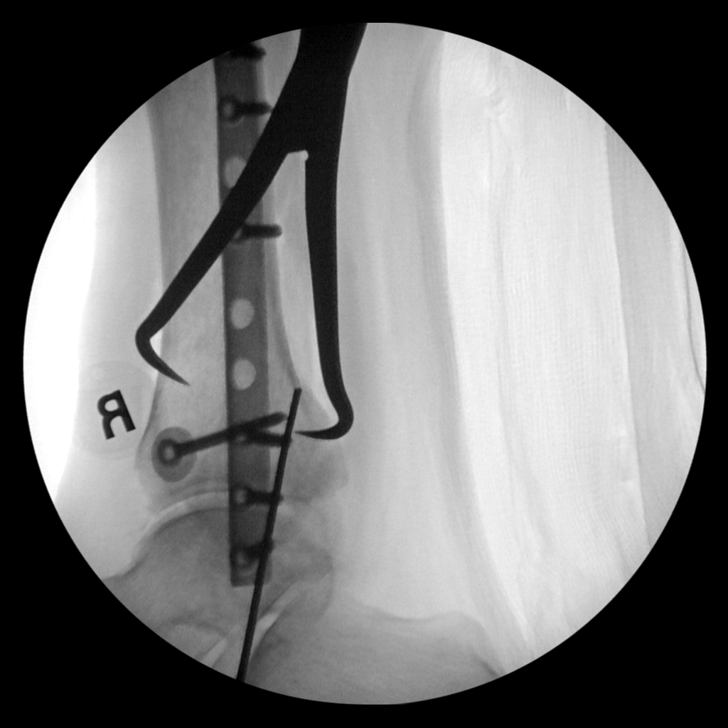
[im 3/8]
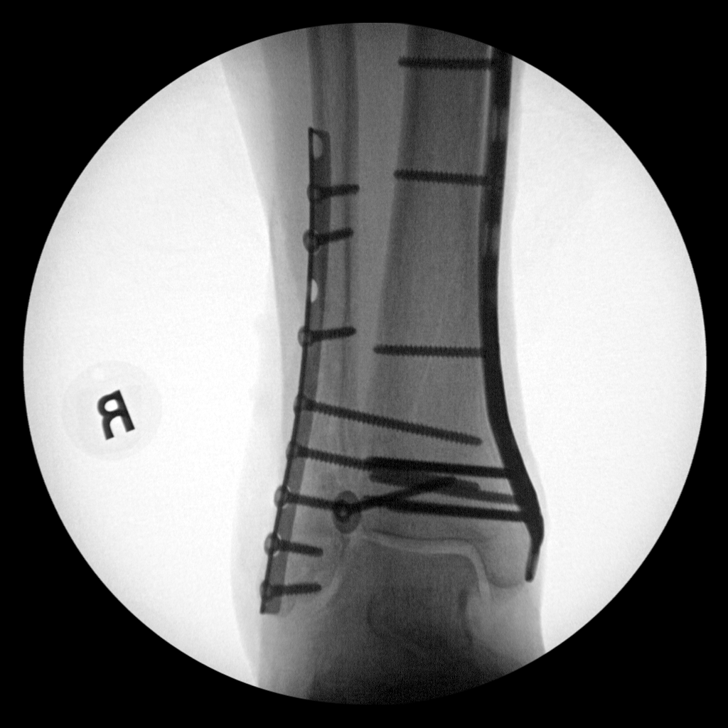
[im 4/8]
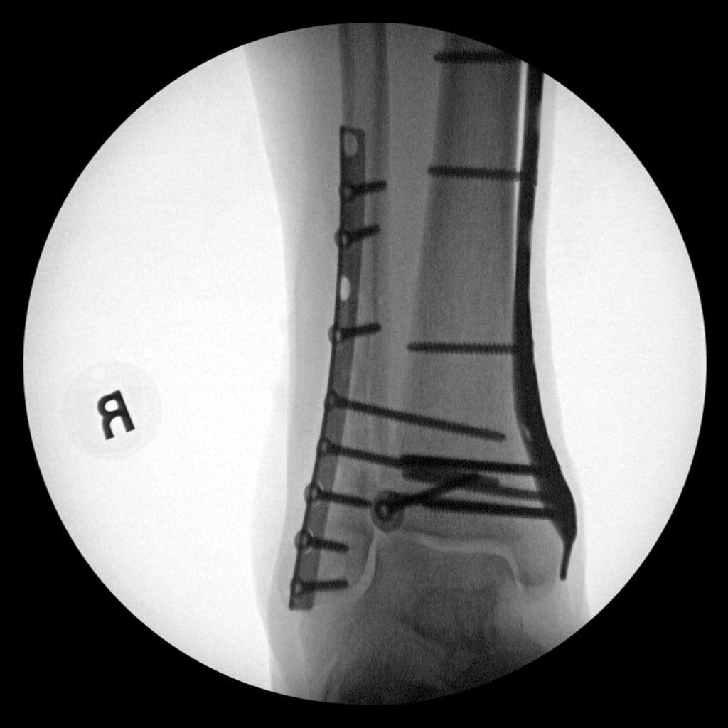
[im 5/8]
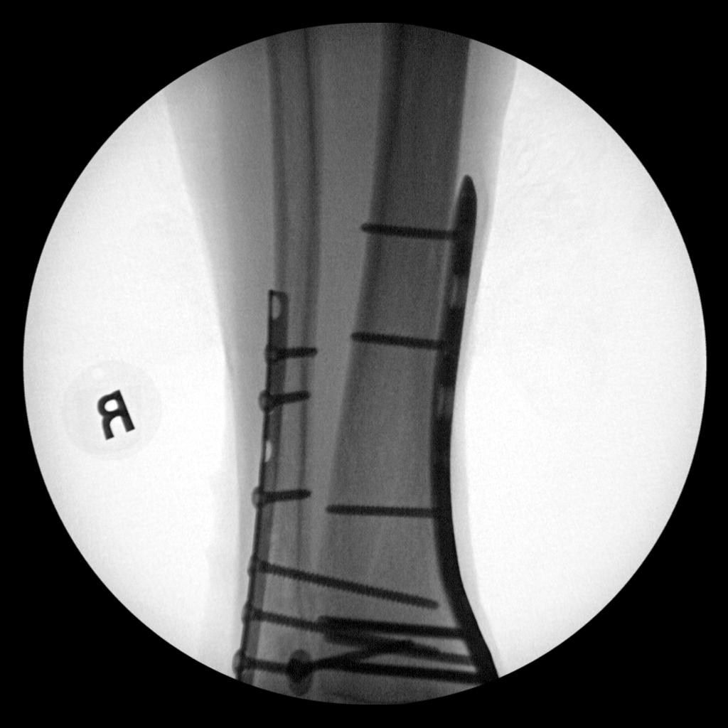
[im 6/8]
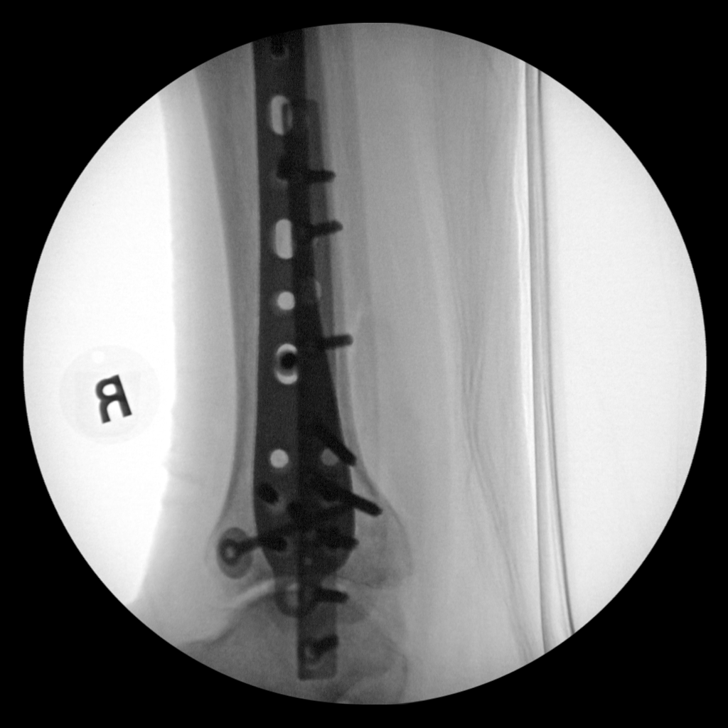
[im 7/8]
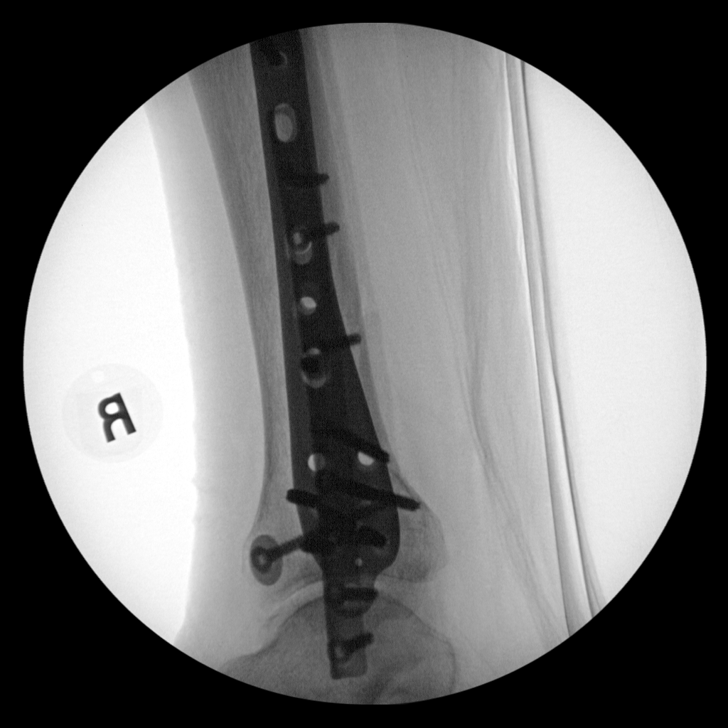
[im 8/8]
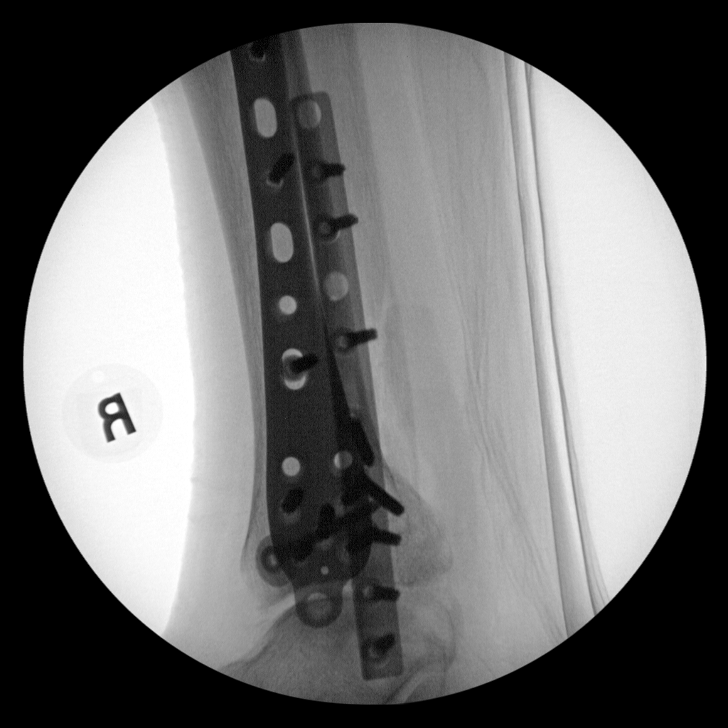

[8 of 8 positions shown; findings below may reference images not displayed]

FINDINGS: Eight C-arm fluoroscopic images were obtained intraoperatively and
submitted for post operative interpretation. These images
demonstrate plate and screw fixation of the distal fibula and tibia
and placement of two screws across the syndesmosis. Please see the
performing provider's procedural report for further detail.
IMPRESSION: Intraoperative fluoroscopy, as detailed above.
